# Patient Record
Sex: Female | Born: 1965 | Race: Black or African American | Hispanic: No | Marital: Single | Smoking: Never smoker
Health system: Southern US, Community
[De-identification: ages and names within clinical notes are randomized; demographics above are authoritative.]

## PROBLEM LIST (undated history)

## (undated) ENCOUNTER — Emergency Department (HOSPITAL_BASED_OUTPATIENT_CLINIC_OR_DEPARTMENT_OTHER): Admission: EM | Payer: Federal, State, Local not specified - PPO | Source: Home / Self Care

## (undated) DIAGNOSIS — G723 Periodic paralysis: Secondary | ICD-10-CM

## (undated) DIAGNOSIS — G4700 Insomnia, unspecified: Secondary | ICD-10-CM

## (undated) DIAGNOSIS — T2120XA Burn of second degree of trunk, unspecified site, initial encounter: Secondary | ICD-10-CM

## (undated) DIAGNOSIS — J309 Allergic rhinitis, unspecified: Secondary | ICD-10-CM

## (undated) DIAGNOSIS — I10 Essential (primary) hypertension: Secondary | ICD-10-CM

## (undated) DIAGNOSIS — D510 Vitamin B12 deficiency anemia due to intrinsic factor deficiency: Secondary | ICD-10-CM

## (undated) HISTORY — DX: Burn of second degree of trunk, unspecified site, initial encounter: T21.20XA

## (undated) HISTORY — PX: HEMORRHOID SURGERY: SHX153

## (undated) HISTORY — PX: TUBAL LIGATION: SHX77

## (undated) HISTORY — DX: Insomnia, unspecified: G47.00

## (undated) HISTORY — DX: Allergic rhinitis, unspecified: J30.9

## (undated) HISTORY — DX: Periodic paralysis: G72.3

## (undated) HISTORY — DX: Vitamin B12 deficiency anemia due to intrinsic factor deficiency: D51.0

## (undated) HISTORY — DX: Essential (primary) hypertension: I10

---

## 1999-06-03 ENCOUNTER — Emergency Department (HOSPITAL_COMMUNITY): Admission: EM | Admit: 1999-06-03 | Discharge: 1999-06-04 | Payer: Self-pay | Admitting: Emergency Medicine

## 2000-11-10 ENCOUNTER — Emergency Department (HOSPITAL_COMMUNITY): Admission: EM | Admit: 2000-11-10 | Discharge: 2000-11-10 | Payer: Self-pay | Admitting: Internal Medicine

## 2001-03-06 ENCOUNTER — Other Ambulatory Visit: Admission: RE | Admit: 2001-03-06 | Discharge: 2001-03-06 | Payer: Self-pay | Admitting: Obstetrics and Gynecology

## 2002-01-19 ENCOUNTER — Emergency Department (HOSPITAL_COMMUNITY): Admission: EM | Admit: 2002-01-19 | Discharge: 2002-01-19 | Payer: Self-pay | Admitting: Emergency Medicine

## 2002-05-17 ENCOUNTER — Other Ambulatory Visit: Admission: RE | Admit: 2002-05-17 | Discharge: 2002-05-17 | Payer: Self-pay | Admitting: Obstetrics and Gynecology

## 2003-05-07 ENCOUNTER — Inpatient Hospital Stay (HOSPITAL_COMMUNITY): Admission: EM | Admit: 2003-05-07 | Discharge: 2003-05-16 | Payer: Self-pay | Admitting: Emergency Medicine

## 2003-06-15 ENCOUNTER — Other Ambulatory Visit: Admission: RE | Admit: 2003-06-15 | Discharge: 2003-06-15 | Payer: Self-pay | Admitting: Obstetrics and Gynecology

## 2003-06-15 ENCOUNTER — Encounter: Admission: RE | Admit: 2003-06-15 | Discharge: 2003-09-13 | Payer: Self-pay | Admitting: General Surgery

## 2003-09-14 ENCOUNTER — Encounter: Admission: RE | Admit: 2003-09-14 | Discharge: 2003-11-23 | Payer: Self-pay | Admitting: General Surgery

## 2004-12-06 ENCOUNTER — Other Ambulatory Visit: Admission: RE | Admit: 2004-12-06 | Discharge: 2004-12-06 | Payer: Self-pay | Admitting: Obstetrics and Gynecology

## 2005-12-20 ENCOUNTER — Other Ambulatory Visit: Admission: RE | Admit: 2005-12-20 | Discharge: 2005-12-20 | Payer: Self-pay | Admitting: Obstetrics and Gynecology

## 2006-10-06 ENCOUNTER — Emergency Department (HOSPITAL_COMMUNITY): Admission: EM | Admit: 2006-10-06 | Discharge: 2006-10-06 | Payer: Self-pay | Admitting: Emergency Medicine

## 2006-11-27 ENCOUNTER — Ambulatory Visit: Payer: Self-pay | Admitting: Internal Medicine

## 2006-11-28 ENCOUNTER — Ambulatory Visit: Payer: Self-pay | Admitting: Internal Medicine

## 2006-11-28 LAB — CONVERTED CEMR LAB
ALT: 13 units/L (ref 0–40)
AST: 18 units/L (ref 0–37)
Albumin: 3.8 g/dL (ref 3.5–5.2)
Alkaline Phosphatase: 72 units/L (ref 39–117)
BUN: 7 mg/dL (ref 6–23)
Basophils Absolute: 0 10*3/uL (ref 0.0–0.1)
Basophils Relative: 0.6 % (ref 0.0–1.0)
CO2: 28 meq/L (ref 19–32)
Calcium: 9.3 mg/dL (ref 8.4–10.5)
Chloride: 107 meq/L (ref 96–112)
Cholesterol: 158 mg/dL (ref 0–200)
Creatinine, Ser: 0.7 mg/dL (ref 0.4–1.2)
Eosinophils Absolute: 0.1 10*3/uL (ref 0.0–0.6)
Eosinophils Relative: 1.5 % (ref 0.0–5.0)
GFR calc Af Amer: 118 mL/min
GFR calc non Af Amer: 98 mL/min
Glucose, Bld: 92 mg/dL (ref 70–99)
HCT: 37.8 % (ref 36.0–46.0)
HDL: 38.3 mg/dL — ABNORMAL LOW (ref >39.0)
Hemoglobin: 12.9 g/dL (ref 12.0–15.0)
LDL Cholesterol: 110 mg/dL — ABNORMAL HIGH (ref 0–99)
Lymphocytes Relative: 48.4 % — ABNORMAL HIGH (ref 12.0–46.0)
MCHC: 34 g/dL (ref 30.0–36.0)
MCV: 89.9 fL (ref 78.0–100.0)
Monocytes Absolute: 0.4 10*3/uL (ref 0.2–0.7)
Monocytes Relative: 8.4 % (ref 3.0–11.0)
Neutro Abs: 1.7 10*3/uL (ref 1.4–7.7)
Neutrophils Relative %: 41.1 % — ABNORMAL LOW (ref 43.0–77.0)
Platelets: 315 10*3/uL (ref 150–400)
Potassium: 3.8 meq/L (ref 3.5–5.1)
RBC: 4.21 M/uL (ref 3.87–5.11)
RDW: 12.5 % (ref 11.5–14.6)
Rheumatoid fact SerPl-aCnc: <20 intl units/mL — ABNORMAL LOW (ref 0.0–20.0)
Sed Rate: 18 mm/hr (ref 0–25)
Sodium: 140 meq/L (ref 135–145)
Total Bilirubin: 1 mg/dL (ref 0.3–1.2)
Total CHOL/HDL Ratio: 4.1
Total Protein: 7.2 g/dL (ref 6.0–8.3)
Triglycerides: 48 mg/dL (ref 0–149)
VLDL: 10 mg/dL (ref 0–40)
Vitamin B-12: 239 pg/mL (ref 211–911)
WBC: 4.2 10*3/uL — ABNORMAL LOW (ref 4.5–10.5)

## 2006-12-02 ENCOUNTER — Ambulatory Visit: Payer: Self-pay | Admitting: Internal Medicine

## 2007-10-02 ENCOUNTER — Emergency Department (HOSPITAL_COMMUNITY): Admission: EM | Admit: 2007-10-02 | Discharge: 2007-10-02 | Payer: Self-pay | Admitting: Emergency Medicine

## 2007-10-07 ENCOUNTER — Ambulatory Visit: Payer: Self-pay | Admitting: Internal Medicine

## 2007-10-07 DIAGNOSIS — T2120XA Burn of second degree of trunk, unspecified site, initial encounter: Secondary | ICD-10-CM | POA: Insufficient documentation

## 2007-10-09 ENCOUNTER — Telehealth (INDEPENDENT_AMBULATORY_CARE_PROVIDER_SITE_OTHER): Payer: Self-pay | Admitting: *Deleted

## 2007-11-04 ENCOUNTER — Emergency Department (HOSPITAL_COMMUNITY): Admission: EM | Admit: 2007-11-04 | Discharge: 2007-11-04 | Payer: Self-pay | Admitting: Emergency Medicine

## 2007-11-10 ENCOUNTER — Ambulatory Visit: Payer: Self-pay | Admitting: Internal Medicine

## 2007-11-10 LAB — CONVERTED CEMR LAB
BUN: 8 mg/dL (ref 6–23)
Basophils Absolute: 0 10*3/uL (ref 0.0–0.1)
Basophils Relative: 0.4 % (ref 0.0–1.0)
Bilirubin Urine: NEGATIVE
CO2: 29 meq/L (ref 19–32)
Calcium: 9.5 mg/dL (ref 8.4–10.5)
Chloride: 106 meq/L (ref 96–112)
Creatinine, Ser: 0.7 mg/dL (ref 0.4–1.2)
Crystals: NEGATIVE
Eosinophils Absolute: 0 10*3/uL (ref 0.0–0.7)
Eosinophils Relative: 0.2 % (ref 0.0–5.0)
GFR calc Af Amer: 117 mL/min
GFR calc non Af Amer: 97 mL/min
Glucose, Bld: 112 mg/dL — ABNORMAL HIGH (ref 70–99)
HCT: 41.5 % (ref 36.0–46.0)
Hemoglobin, Urine: NEGATIVE
Hemoglobin: 13.7 g/dL (ref 12.0–15.0)
Ketones, ur: NEGATIVE mg/dL
Leukocytes, UA: NEGATIVE
Lymphocytes Relative: 26.7 % (ref 12.0–46.0)
MCHC: 33 g/dL (ref 30.0–36.0)
MCV: 90.1 fL (ref 78.0–100.0)
Monocytes Absolute: 0.2 10*3/uL (ref 0.1–1.0)
Monocytes Relative: 4.3 % (ref 3.0–12.0)
Mucus, UA: NEGATIVE
Neutro Abs: 4 10*3/uL (ref 1.4–7.7)
Neutrophils Relative %: 68.4 % (ref 43.0–77.0)
Nitrite: NEGATIVE
Platelets: 279 10*3/uL (ref 150–400)
Potassium: 4.5 meq/L (ref 3.5–5.1)
RBC / HPF: NONE SEEN
RBC: 4.61 M/uL (ref 3.87–5.11)
RDW: 11.8 % (ref 11.5–14.6)
Sodium: 141 meq/L (ref 135–145)
Specific Gravity, Urine: >=1.03 (ref 1.000–1.03)
Urine Glucose: NEGATIVE mg/dL
Urobilinogen, UA: 0.2 (ref 0.0–1.0)
WBC: 5.7 10*3/uL (ref 4.5–10.5)
pH: 6 (ref 5.0–8.0)

## 2007-11-11 ENCOUNTER — Telehealth: Payer: Self-pay | Admitting: Internal Medicine

## 2008-06-16 ENCOUNTER — Ambulatory Visit: Payer: Self-pay | Admitting: Internal Medicine

## 2008-06-16 ENCOUNTER — Telehealth: Payer: Self-pay | Admitting: Family Medicine

## 2008-06-22 ENCOUNTER — Telehealth: Payer: Self-pay | Admitting: Internal Medicine

## 2009-02-17 ENCOUNTER — Telehealth: Payer: Self-pay | Admitting: Internal Medicine

## 2009-02-18 ENCOUNTER — Encounter: Payer: Self-pay | Admitting: Internal Medicine

## 2009-02-23 ENCOUNTER — Ambulatory Visit: Payer: Self-pay | Admitting: Cardiovascular Disease

## 2009-02-23 ENCOUNTER — Ambulatory Visit: Payer: Self-pay | Admitting: Internal Medicine

## 2009-02-23 DIAGNOSIS — I10 Essential (primary) hypertension: Secondary | ICD-10-CM | POA: Insufficient documentation

## 2009-02-23 LAB — CONVERTED CEMR LAB
Free T4: 0.7 ng/dL (ref 0.6–1.6)
TSH: 1.17 microintl units/mL (ref 0.35–5.50)

## 2009-02-24 ENCOUNTER — Telehealth: Payer: Self-pay | Admitting: Internal Medicine

## 2009-02-28 ENCOUNTER — Ambulatory Visit: Payer: Self-pay | Admitting: Internal Medicine

## 2009-03-03 ENCOUNTER — Telehealth: Payer: Self-pay | Admitting: Internal Medicine

## 2009-03-03 ENCOUNTER — Ambulatory Visit: Payer: Self-pay | Admitting: Internal Medicine

## 2009-03-08 ENCOUNTER — Ambulatory Visit: Payer: Self-pay | Admitting: Internal Medicine

## 2009-03-08 DIAGNOSIS — G4700 Insomnia, unspecified: Secondary | ICD-10-CM | POA: Insufficient documentation

## 2009-03-17 ENCOUNTER — Telehealth: Payer: Self-pay | Admitting: Internal Medicine

## 2009-07-21 ENCOUNTER — Ambulatory Visit: Payer: Self-pay | Admitting: Internal Medicine

## 2009-07-29 ENCOUNTER — Ambulatory Visit: Payer: Self-pay | Admitting: Family Medicine

## 2009-07-31 ENCOUNTER — Telehealth (INDEPENDENT_AMBULATORY_CARE_PROVIDER_SITE_OTHER): Payer: Self-pay | Admitting: *Deleted

## 2009-08-11 ENCOUNTER — Telehealth: Payer: Self-pay | Admitting: Internal Medicine

## 2009-11-14 ENCOUNTER — Ambulatory Visit: Payer: Self-pay | Admitting: Internal Medicine

## 2009-11-14 DIAGNOSIS — G723 Periodic paralysis: Secondary | ICD-10-CM | POA: Insufficient documentation

## 2009-11-14 DIAGNOSIS — J309 Allergic rhinitis, unspecified: Secondary | ICD-10-CM | POA: Insufficient documentation

## 2009-11-21 ENCOUNTER — Telehealth (INDEPENDENT_AMBULATORY_CARE_PROVIDER_SITE_OTHER): Payer: Self-pay | Admitting: *Deleted

## 2009-11-23 ENCOUNTER — Ambulatory Visit: Payer: Self-pay | Admitting: Internal Medicine

## 2009-11-28 ENCOUNTER — Ambulatory Visit: Payer: Self-pay | Admitting: Internal Medicine

## 2009-11-29 ENCOUNTER — Telehealth: Payer: Self-pay | Admitting: Internal Medicine

## 2009-12-14 ENCOUNTER — Telehealth: Payer: Self-pay | Admitting: Internal Medicine

## 2009-12-27 ENCOUNTER — Telehealth: Payer: Self-pay | Admitting: Internal Medicine

## 2010-01-01 ENCOUNTER — Encounter: Payer: Self-pay | Admitting: Internal Medicine

## 2010-02-01 ENCOUNTER — Telehealth: Payer: Self-pay | Admitting: Internal Medicine

## 2010-03-26 ENCOUNTER — Telehealth: Payer: Self-pay | Admitting: Internal Medicine

## 2010-05-13 ENCOUNTER — Emergency Department (HOSPITAL_COMMUNITY): Admission: EM | Admit: 2010-05-13 | Discharge: 2010-05-13 | Payer: Self-pay | Admitting: Emergency Medicine

## 2010-05-21 ENCOUNTER — Telehealth: Payer: Self-pay | Admitting: Internal Medicine

## 2010-05-25 ENCOUNTER — Telehealth: Payer: Self-pay | Admitting: Internal Medicine
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
## 2010-07-19 NOTE — Assessment & Plan Note (Signed)
Summary: per flag add on @4 :30 pt aware/elev bp/cd   Vital Signs:  Patient profile:   45 year old female Weight:      172 pounds BMI:     28.73 Temp:     99.0 degrees F oral Pulse rate:   71 / minute BP sitting:   138 / 92  (left arm) Cuff size:   regular  Vitals Entered By: Lamar Sprinkles, CMA (November 23, 2009 5:17 PM) CC: Elevated BP, h/a's   Primary Care Provider:  Norins  CC:  Elevated BP and h/a's.  History of Present Illness: Patient presents for elevated blood pressure despite 4 drug therapy. She does report that she has been taking pseudoephedrine daily since her last visit until told to stop yesterday. BP 138/02 off sudafed. She does have persistent right parietal HA. She denies any focal neurologic findings.   Current Medications (verified): 1)  Tribenzor 40-10-25 Mg Tabs (Olmesartan-Amlodipine-Hctz) .Marland Kitchen.. 1 By Mouth Once Daily 2)  Bystolic 10 Mg Tabs (Nebivolol Hcl) .Marland Kitchen.. 1 By Mouth Once Daily 3)  Temazepam 15 Mg Caps (Temazepam) .Marland Kitchen.. 1 By Mouth  At Bedtime 4)  Potassium Chloride Cr 8 Meq Cr-Tabs (Potassium Chloride) .Marland Kitchen.. 1 By Mouth Once Daily  Allergies (verified): 1)  ! Hydrocodone PMH-FH-SH reviewed-no changes except otherwise noted  Review of Systems       The patient complains of headaches.  The patient denies anorexia, fever, weight loss, hoarseness, chest pain, syncope, dyspnea on exertion, abdominal pain, severe indigestion/heartburn, muscle weakness, difficulty walking, depression, and enlarged lymph nodes.    Physical Exam  General:  WNWD AA female in no acute distress Eyes:  pupils equal, pupils round, corneas and lenses clear, and no injection.   Lungs:  normal respiratory effort and normal breath sounds.   Heart:  normal rate and regular rhythm.   Msk:  enalrgement of tendon - flexor pollicis longus right. Tender in the anatomic snuff box. Postive finkelstein test.  Neurologic:  alert & oriented X3, cranial nerves II-XII intact, and gait normal.     Skin:  turgor normal and color normal-deeply tanned.   Psych:  Oriented X3, memory intact for recent and remote, normally interactive, and good eye contact.     Impression & Recommendations:  Problem # 1:  HYPERTENSION, SEVERE (ICD-401.0) Elevations most likely due to decongestant. Also, headache most likely related to HTN  Plan - leave off sudafed           monitor BP - call in results  Her updated medication list for this problem includes:    Tribenzor 40-10-25 Mg Tabs (Olmesartan-amlodipine-hctz) .Marland Kitchen... 1 by mouth once daily    Bystolic 10 Mg Tabs (Nebivolol hcl) .Marland Kitchen... 1 by mouth once daily  Problem # 2:  DE QUERVAIN'S TENOSYNOVITIS (ICD-727.04) classic presentation - due to over use of right wrist. Declined cortisone injection.  Plan - reduce work of right wrist if possible          heat          NSAIDs  Complete Medication List: 1)  Tribenzor 40-10-25 Mg Tabs (Olmesartan-amlodipine-hctz) .Marland Kitchen.. 1 by mouth once daily 2)  Bystolic 10 Mg Tabs (Nebivolol hcl) .Marland Kitchen.. 1 by mouth once daily 3)  Temazepam 15 Mg Caps (Temazepam) .Marland Kitchen.. 1 by mouth  at bedtime 4)  Potassium Chloride Cr 8 Meq Cr-tabs (Potassium chloride) .Marland Kitchen.. 1 by mouth once daily

## 2010-07-19 NOTE — Progress Notes (Signed)
  Phone Note Outgoing Call   Reason for Call: Get patient information Summary of Call: please call patient to see if her tenosynovitis is better after injection.  Thanks Initial call taken by: Jacques Navy MD,  November 29, 2009 8:39 PM  Follow-up for Phone Call        left mess to call office back w/update Follow-up by: Lamar Sprinkles, CMA,  December 04, 2009 10:17 AM  Additional Follow-up for Phone Call Additional follow up Details #1::        called pt no answer    Additional Follow-up for Phone Call Additional follow up Details #2::    lmoam Follow-up by: Ami Bullins CMA,  December 07, 2009 1:56 PM  Additional Follow-up for Phone Call Additional follow up Details #3:: Details for Additional Follow-up Action Taken: letter sent to pt to call with an update. Additional Follow-up by: Ami Bullins CMA,  December 08, 2009 10:33 AM

## 2010-07-19 NOTE — Progress Notes (Signed)
Summary: CALL  Phone Note Call from Patient Call back at [phone]   Summary of Call: Patient is requesting a call back.  Initial call taken by: Lamar Sprinkles, CMA,  August 11, 2009 2:22 PM  Follow-up for Phone Call        Pt c/o sharp chest pain & pressure x 1 wk. Pain is only when she lays down and immediatly goes away when she sits up.  Follow-up by: Lamar Sprinkles, CMA,  August 11, 2009 5:21 PM  Additional Follow-up for Phone Call Additional follow up Details #1::        called - left a message - sounds like reflux. Recommend prilosec OTC, elevated head of bed. For persistent concerns call nurse line in am for appt in Sat clinic Additional Follow-up by: Jacques Navy MD,  August 11, 2009 5:53 PM
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
## 2010-07-19 NOTE — Assessment & Plan Note (Signed)
Summary: wrist injection/SD   Vital Signs:  Patient profile:   45 year old female Height:      65 inches Weight:      171 pounds BMI:     28.56 O2 Sat:      98 % on Room air Temp:     97.0 degrees F oral Pulse rate:   67 / minute BP sitting:   140 / 88  (left arm) Cuff size:   regular  Vitals Entered By: Bill Salinas CMA (November 28, 2009 4:41 PM)  O2 Flow:  Room air   Primary Care Provider:  Decarlos Empey   History of Present Illness: At last visit she was diagnosed with DeQuervain's tenosynovitis. She declined steroid injection in favor of heat and NSAID. She returns today for persistent pain and is requesting a cortisone injection.   Current Medications (verified): 1)  Tribenzor 40-10-25 Mg Tabs (Olmesartan-Amlodipine-Hctz) .Marland Kitchen.. 1 By Mouth Once Daily 2)  Bystolic 10 Mg Tabs (Nebivolol Hcl) .Marland Kitchen.. 1 By Mouth Once Daily 3)  Temazepam 15 Mg Caps (Temazepam) .Marland Kitchen.. 1 By Mouth  At Bedtime 4)  Potassium Chloride Cr 8 Meq Cr-Tabs (Potassium Chloride) .Marland Kitchen.. 1 By Mouth Once Daily  Allergies (verified): 1)  ! Hydrocodone PMH-FH-SH reviewed-no changes except otherwise noted  Review of Systems MS:  Complains of loss of strength; denies joint pain, joint redness, joint swelling, low back pain, mid back pain, muscle aches, and cramps.  Physical Exam  General:  Well-developed,well-nourished,in no acute distress; alert,appropriate and cooperative throughout examination Msk:  Postive Finklestein's test, tender at the right anatomic snuff box of the wrist. Bony protruberance just cephlad to the anatomic snuff box.    Impression & Recommendations:  Problem # 1:  DE QUERVAIN'S TENOSYNOVITIS (ICD-727.04)  tendon sheath steroid injection. Patient still had pain at the close of the visit. If the pain persists will refer to hand surgeon.  Orders: Injection, Tendon / Ligament (62952) Depo- Medrol 40mg  (J1030)  Complete Medication List: 1)  Tribenzor 40-10-25 Mg Tabs (Olmesartan-amlodipine-hctz)  .Marland Kitchen.. 1 by mouth once daily 2)  Bystolic 10 Mg Tabs (Nebivolol hcl) .Marland Kitchen.. 1 by mouth once daily 3)  Temazepam 15 Mg Caps (Temazepam) .Marland Kitchen.. 1 by mouth  at bedtime 4)  Potassium Chloride Cr 8 Meq Cr-tabs (Potassium chloride) .Marland Kitchen.. 1 by mouth once daily   Procedure Note  Injections: The patient complains of pain. Indication: acute pain  Procedure # 1: tendon sheath injection    Location: anatomic snuff box right wrist    Technique: #30 g 1 " needle    Medication: 40 mg depomedrol    Anesthesia: 2% xylocaine    Comment: verbal informed consent. after betadiene prep, topical skin freeze spray infiltrated tendon with 40mg  depo and .5cc 2% xylo. Pt claimed this hurt. She did have some decreased pain.   Cleaned and prepped with: betadine Wound dressing: bandaid Additional Instructions: routine injection precautions.

## 2010-07-19 NOTE — Progress Notes (Signed)
Summary: CALL A NURSE   Phone Note From Other Clinic   Summary of Call: Recieved fax from Call a nurse, pt c/o elevated bp and was advised to call office and scheduled apt. Will f/u w/ pt today for update.  Initial call taken by: Lamar Sprinkles, CMA,  December 27, 2009 12:28 PM  Follow-up for Phone Call        left mess to call office back................Marland KitchenLamar Sprinkles, CMA  December 27, 2009 2:36 PM   LMOVM for pt to call back.Alvy Beal Archie CMA  December 28, 2009 1:50 PM   left mess to call office back.............Marland KitchenLamar Sprinkles, CMA  December 29, 2009 6:03 PM   Additional Follow-up for Phone Call Additional follow up Details #1::        left mess to call office back. Multiple attempts, signing phone note. Will mail letter Additional Follow-up by: Lamar Sprinkles, CMA,  January 01, 2010 6:10 PM

## 2010-07-19 NOTE — Letter (Signed)
Summary: Out of Work  [facility]  [address] [place], [place] [address]   Phone: [phone]  Fax: [phone]    Nov 14, 2009   Employee:  Bently A Folk    To Whom It May Concern:   For Medical reasons, please excuse the above named employee from work for the following dates:  Start:   May 31 , 2011  End:   November 15, 2009  If you need additional information, please feel free to contact our office.         Sincerely,    Jacques Navy MD

## 2010-07-19 NOTE — Progress Notes (Signed)
Summary: CALL  Phone Note Call from Patient Call back at [phone]   Summary of Call: Patient is requesting a call back regarding an rx.  Initial call taken by: Lamar Sprinkles, CMA,  May 21, 2010 2:02 PM  Follow-up for Phone Call        pt states she was given RectaGel [facility] and her Murrell Converse has moved. She states she needs this gel ASAP [facility] to [facility] [address] please advise Follow-up by: Ami Bullins CMA,  May 21, 2010 2:28 PM  Additional Follow-up for Phone Call Additional follow up Details #1::        ok Additional Follow-up by: Etta Grandchild MD,  May 21, 2010 2:37 PM    Additional Follow-up for Phone Call Additional follow up Details #2::    medication called into [facility] [address] Follow-up by: Ami Bullins CMA,  May 21, 2010 3:12 PM  New/Updated Medications: * RECTAGEL HC 20G TUBE apply to affected area three times a day as needed
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
## 2010-07-19 NOTE — Assessment & Plan Note (Signed)
Summary: ELEV BP (168/90)AND SINUS INFECTION /NWS--rs'/cd   Vital Signs:  Patient profile:   45 year old female Height:      65 inches Weight:      173 pounds BMI:     28.89 O2 Sat:      98 % on Room air Temp:     98.9 degrees F oral Pulse rate:   62 / minute BP sitting:   144 / 98  (left arm) Cuff size:   regular  Vitals Entered By: Bill Salinas CMA (July 21, 2009 4:02 PM)  O2 Flow:  Room air CC: pt here for evaluation of elevated BP, she also complains of frequent headaches and noses bleeds x 2 weeks/ ab   Primary Care Provider:  Kinte Trim  CC:  pt here for evaluation of elevated BP and she also complains of frequent headaches and noses bleeds x 2 weeks/ ab.  History of Present Illness: Patient with persistently elevated blood pressure: readings at home with systolics 170's, diastoics 90-100. she has had headaches and nosebleeds. No focal neurolgic symptoms. Chart reviewed - she has had normal CT brain, normal CT angio to rule out RAS. She has been on bystolic 10mg  once daily and lisinopril/hct 20/25 once daily.  Current Medications (verified): 1)  Retin-A .... Every Other Day 2)  Lisinopril-Hydrochlorothiazide 20-25 Mg Tabs (Lisinopril-Hydrochlorothiazide) .Marland Kitchen.. 1 By Mouth Once Daily 3)  Bystolic 10 Mg Tabs (Nebivolol Hcl) .Marland Kitchen.. 1 By Mouth Once Daily 4)  Temazepam 15 Mg Caps (Temazepam) .Marland Kitchen.. 1 By Mouth  At Bedtime  Allergies (verified): 1)  ! Hydrocodone PMH-FH-SH reviewed-no changes except otherwise noted  Review of Systems       The patient complains of headaches.  The patient denies anorexia, fever, weight loss, weight gain, decreased hearing, hoarseness, chest pain, syncope, dyspnea on exertion, hemoptysis, abdominal pain, severe indigestion/heartburn, incontinence, muscle weakness, difficulty walking, depression, abnormal bleeding, and angioedema.    Physical Exam  General:  Well-developed,well-nourished,in no acute distress; alert,appropriate and cooperative  throughout examination Head:  normocephalic and atraumatic.   Eyes:  vision grossly intact, pupils equal, pupils round, corneas and lenses clear, and no injection.   Lungs:  normal respiratory effort.   Heart:  normal rate and regular rhythm.   Neurologic:  alert & oriented X3, cranial nerves II-XII intact, and gait normal.   Skin:  turgor normal and color normal.   Psych:  Oriented X3, normally interactive, and good eye contact.     Impression & Recommendations:  Problem # 1:  HYPERTENSION, SEVERE (ICD-401.0) Difficult to control BP.  Plan - life style improvement: provided information on disease state, diet and exercise from [facility] and UpToDate, as well as handouts.           continue byustolic 10mg  once daily           change to tribenzor (olmesartan 40mg , amlodipine 10, HCTZ 25) once daily            return in 10-14 days for follow -up   Her updated medication list for this problem includes:    Tribenzor 40-10-25 Mg Tabs (Olmesartan-amlodipine-hctz) .Marland Kitchen... 1 by mouth once daily    Bystolic 10 Mg Tabs (Nebivolol hcl) .Marland Kitchen... 1 by mouth once daily  Complete Medication List: 1)  Retin-a  .... Every other day 2)  Tribenzor 40-10-25 Mg Tabs (Olmesartan-amlodipine-hctz) .Marland Kitchen.. 1 by mouth once daily 3)  Bystolic 10 Mg Tabs (Nebivolol hcl) .Marland Kitchen.. 1 by mouth once daily 4)  Temazepam 15 Mg Caps (  Temazepam) .Marland Kitchen.. 1 by mouth  at bedtime

## 2010-07-19 NOTE — Progress Notes (Signed)
Summary: Call a nurse report x3  Phone Note Other Incoming   Caller: Call a nurse report Summary of Call: Documentation of fax rec'd:  07-29-2009 7:33:43 am---Operator:  Camilled DiMatteis. Patient called c/o HA, eyes hurting, and chest hurting as well. Patient is congested and cannot cough up phlem despite trying. Per patient symptoms started yesterday. Temp 101.9 on 2-11 and at time of call. Patient would like to be seen in office and was instructed to call back when the office opened. Initial call taken by: Lucious Groves,  July 31, 2009 11:35 AM  Follow-up for Phone Call        Patient called back to nurse--Operator:  Geanie Berlin    07-29-2009 9:12:26 am--- C/O dry cough with chest and rib pain, HA, Right ear and body aches. Temp 101.9 by mouth 7:30 am, BP  117/70 7:30 am. Operator advised patient be seen in office within 4 hours. Follow-up by: Lucious Groves,  July 31, 2009 11:56 AM  Additional Follow-up for Phone Call Additional follow up Details #1::        Patient called back to nurse--Operator:  Migdalia Dk   07-30-2009 5:17:17 am--- Patient called stating that she was seen yesterday dx with flu and placed on Tamiflu. Patient wanted to know how it worked. Patient c/o freesing and fever continues. Operator advised patient how Tamiflu works, to take Tylenol or Ibuprofen for fever, and increase fluids and rest. Patient expressed understanding per operator. Additional Follow-up by: Lucious Groves,  July 31, 2009 12:03 PM    Additional Follow-up for Phone Call Additional follow up Details #2::    would not have her come to the office if already started on tamiflu for influenza type illness. She should come to the office if she has respiratory distress or refractory N/V Follow-up by: Jacques Navy MD,  July 31, 2009 1:03 PM  Additional Follow-up for Phone Call Additional follow up Details #3:: Details for Additional Follow-up Action Taken: left message on voicemail to call back  to office.Lucious Groves,  August 01, 2009 3:22 PM  Left message on voicemail to call back to office. Lucious Groves  August 02, 2009 11:14 AM    informed pt of advisement, she states she is feeling better Additional Follow-up by: Ami Bullins CMA,  August 03, 2009 1:29 PM

## 2010-07-19 NOTE — Progress Notes (Signed)
Summary: H/a & elevated BP   Phone Note Other Incoming   Caller: Pt [phone] Summary of Call: pt states she is still having nauseated headaches and nothing OTC is helping. What do you advise? Initial call taken by: Ami Bullins CMA,  November 21, 2009 4:42 PM  Follow-up for Phone Call        she needs to be seen Follow-up by: Etta Grandchild MD,  November 22, 2009 7:52 AM  Additional Follow-up for Phone Call Additional follow up Details #1::        Patient is requesting work in apt with Dr Debby Bud tomorrow. She has been taking sudafed daily, not the limited 2 days as she was told. She c/o elevated bp, 160/101 today. Advised pt to stop sudafed and come in for eval. Pt declined office visit wants wk in w/norins.  Additional Follow-up by: Lamar Sprinkles, CMA,  November 22, 2009 2:57 PM    Additional Follow-up for Phone Call Additional follow up Details #2::    ok for work in tomorrow Follow-up by: Jacques Navy MD,  November 22, 2009 5:37 PM  Additional Follow-up for Phone Call Additional follow up Details #3:: Details for Additional Follow-up Action Taken: Please add to IDX in 4:30 apt tomorrow. I already spoke w/patient. Lynford Humphrey!!!!................Marland KitchenLamar Sprinkles, CMA  November 22, 2009 6:55 PM     Added to sched today @4 :30pm Additional Follow-up by: Verdell Face,  November 23, 2009 8:14 AM

## 2010-07-19 NOTE — Progress Notes (Signed)
Summary: Call Report  Phone Note Other Incoming   Caller: [facility]A-[facility]urse Summary of Call: [facility] Triage Call Report Triage Record Num: [id number] Operator: Geanie Berlin Patient Name: Hannah Conley Call Date & Time: 03/24/2010 8:50:52AM Patient Phone: [phone] PCP: Illene Regulus Patient Gender: Female PCP Fax : [phone] Patient DOB: December 05, 1965 Practice Name: [facility] Reason for Call: 45 yo LMP: Depoporvera. CAlling re HTN; with > BP since 03/18/10. On Lisinopril & Bystolic. Reports daily headaches. BP 160/96 L arm 0730. Replrts blurred vision L eye. Advised to see ED now fornew onset of blurred vision per HTN, Diagnosed or Suspected Guideline. Protocol(s) Used: Hypertension, Diagnosed or Suspected Recommended Outcome per Protocol: See ED Immediately Reason for Outcome: New onset of visual disturbances such as double or blurred vision, spots before eyes or flashing lights Care Advice:  ~ Protect the patient from falling or other harm.  ~ Another adult should drive.  ~ Do not give the patient anything to eat or drink.  ~ IMMEDIATE ACTION Write down provider's name. List or place the following in a bag for transport with the patient: current prescription and/or nonprescription medications; alternative treatments, therapies and medications; and street drugs.  ~ 10/ Initial call taken by: Margaret Pyle, CMA,  March 26, 2010 8:22 AM

## 2010-07-19 NOTE — Progress Notes (Signed)
Summary: UPDATE  Phone Note Call from Patient Call back at [phone]   Summary of Call: Pt returned call regarding update of her wrist. She is doing much better. Still has concerns about elevated bp at times. She will monitor BP and bring in record of readings. She is taking meds as directed. Pt will contact office next week for nurse visit bp check if she has time off work or if BP remains elevated.  Initial call taken by: Lamar Sprinkles, CMA,  December 14, 2009 10:41 AM

## 2010-07-19 NOTE — Progress Notes (Signed)
Summary: REQ ALT MED  Phone Note Call from Patient Call back at [phone]   Summary of Call: Patient is requesting a call back.  Initial call taken by: Lamar Sprinkles, CMA,  May 25, 2010 11:22 AM  Follow-up for Phone Call        Rectagel is not avail, pharm suggests anamantle as alternative. Please advise.   Pt req a call back when complete.  Follow-up by: Lamar Sprinkles, CMA,  May 25, 2010 11:42 AM  Additional Follow-up for Phone Call Additional follow up Details #1::        called pt. eScribed anamantle HC    New/Updated Medications: LIDOCAINE-HYDROCORTISONE ACE 3-0.5 % CREA (LIDOCAINE-HYDROCORTISONE ACE) applya two times a day as needed for hemorrhoids Prescriptions: LIDOCAINE-HYDROCORTISONE ACE 3-0.5 % CREA (LIDOCAINE-HYDROCORTISONE ACE) applya two times a day as needed for hemorrhoids  #60g x 1   Entered and Authorized by:   Jacques Navy MD   Signed by:   Jacques Navy MD on 05/25/2010   Method used:   Electronically to        [facility] (retail)       [address]       [place], [place]  [address]       Ph: [phone] or [phone]       Fax: [phone]   RxID:   [id number]

## 2010-07-19 NOTE — Letter (Signed)
Summary: Generic Letter  [facility]  [address] [place], [place] [address]   Phone: [phone]  Fax: [phone]    01/01/2010  Hannah Conley [address], [address] [place], [place]  [address]  Dear Ms. Lick,   We were recently informed of a call to our after hours service with concerns of elevated blood pressure. Please contact the office for follow up with Dr Debby Bud for any continued concerns.      Sincerely,   Lamar Sprinkles, CMA (AAMA) for Dr Judie Petit. Norins

## 2010-08-29 LAB — GC/CHLAMYDIA PROBE AMP, GENITAL
Chlamydia, DNA Probe: NEGATIVE
GC Probe Amp, Genital: NEGATIVE

## 2010-08-29 LAB — URINALYSIS, ROUTINE W REFLEX MICROSCOPIC
Bilirubin Urine: NEGATIVE
Glucose, UA: NEGATIVE mg/dL
Hgb urine dipstick: NEGATIVE
Ketones, ur: NEGATIVE mg/dL
Nitrite: NEGATIVE
Protein, ur: NEGATIVE mg/dL
Specific Gravity, Urine: 1.021 (ref 1.005–1.030)
Urobilinogen, UA: 0.2 mg/dL (ref 0.0–1.0)
pH: 8.5 — ABNORMAL HIGH (ref 5.0–8.0)

## 2010-08-29 LAB — DIFFERENTIAL
Basophils Absolute: 0 10*3/uL (ref 0.0–0.1)
Basophils Relative: 1 % (ref 0–1)
Eosinophils Absolute: 0.1 10*3/uL (ref 0.0–0.7)
Eosinophils Relative: 1 % (ref 0–5)
Lymphocytes Relative: 57 % — ABNORMAL HIGH (ref 12–46)
Lymphs Abs: 2.7 10*3/uL (ref 0.7–4.0)
Monocytes Absolute: 0.4 10*3/uL (ref 0.1–1.0)
Monocytes Relative: 9 % (ref 3–12)
Neutro Abs: 1.5 10*3/uL — ABNORMAL LOW (ref 1.7–7.7)
Neutrophils Relative %: 32 % — ABNORMAL LOW (ref 43–77)

## 2010-08-29 LAB — CBC
HCT: 40.3 % (ref 36.0–46.0)
Hemoglobin: 13.8 g/dL (ref 12.0–15.0)
MCH: 31 pg (ref 26.0–34.0)
MCHC: 34.1 g/dL (ref 30.0–36.0)
MCV: 90.8 fL (ref 78.0–100.0)
Platelets: 314 10*3/uL (ref 150–400)
RBC: 4.44 MIL/uL (ref 3.87–5.11)
RDW: 12.9 % (ref 11.5–15.5)
WBC: 4.7 10*3/uL (ref 4.0–10.5)

## 2010-08-29 LAB — BASIC METABOLIC PANEL
BUN: 9 mg/dL (ref 6–23)
CO2: 26 mEq/L (ref 19–32)
Calcium: 9.2 mg/dL (ref 8.4–10.5)
Chloride: 109 mEq/L (ref 96–112)
Creatinine, Ser: 0.87 mg/dL (ref 0.4–1.2)
GFR calc Af Amer: >60 mL/min (ref >60)
GFR calc non Af Amer: >60 mL/min (ref >60)
Glucose, Bld: 102 mg/dL — ABNORMAL HIGH (ref 70–99)
Potassium: 4.3 mEq/L (ref 3.5–5.1)
Sodium: 140 mEq/L (ref 135–145)

## 2010-08-29 LAB — URINE CULTURE
Colony Count: NO GROWTH
Culture  Setup Time: 201111271724
Culture: NO GROWTH

## 2010-08-29 LAB — WET PREP, GENITAL
Clue Cells Wet Prep HPF POC: NONE SEEN
Trich, Wet Prep: NONE SEEN
Yeast Wet Prep HPF POC: NONE SEEN

## 2010-08-29 LAB — POCT PREGNANCY, URINE: Preg Test, Ur: NEGATIVE

## 2010-10-30 NOTE — Assessment & Plan Note (Signed)
[facility]                                 ON-CALL NOTE   NAME:Hannah Conley, Hannah Conley                    MRN:          [id number]  DATE:03/02/2009                            DOB:          [birth year]    PHONE NUMBER:  [phone].   PRIMARY CARE PHYSICIAN:  Rosalyn Gess. Norins, MD   DATE OF SERVICE:  On March 02, 2009, at approximately 9:30 at night.   SUBJECTIVE:  Hannah Conley has been having elevated blood pressures for  several days, but tonight it has reached a peak of 191/149.  She denies  any symptoms such as chest pain, shortness of breath.  No headache.  She  does have some heaviness behind her eyes.  She denies weakness,  numbness, slurred speech, vomiting, nausea.  We reviewed her medications  and it appears she has some room to increase her lisinopril HCTZ 10/12.5  mg.  I will have her take an additional dose this evening and she has an  appointment in the morning with her primary care doctor.  She was  instructed on symptoms that should have her go to the emergency room.  In addition, if her blood pressure remains this elevated, despite  additional medication, I recommend she go in to be seen this evening.     Kerby Nora, MD  Electronically Signed    AB/MedQ  DD: 03/02/2009  DT: 03/03/2009  Job #: [id number]
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
## 2010-11-02 NOTE — H&P (Signed)
Hannah Conley, KLINKE             ACCOUNT NO.:  [id number]   MEDICAL RECORD NO.:  [id number]           PATIENT TYPE:   LOCATION:                                 FACILITY:   PHYSICIAN:  Guy Sandifer. Henderson Cloud, M.D. DATE OF BIRTH:  07-Aug-1965   DATE OF ADMISSION:  DATE OF DISCHARGE:                              HISTORY & PHYSICAL   CHIEF COMPLAINT:  Irregular menses.   HISTORY OF PRESENT ILLNESS:  This patient is a 45 year old divorced  black female, G0, P0 with irregular menses.  Some flows are becoming  quite heavy, changing a tampon every 1-2 hours.  Ultrasound in my office  on January 06, 2008, revealed uterus measuring 7.2 x 3.2 x 4.2 cm.  Ovaries  appeared normal.  On sonohysterogram, there is a 5-mm polypoid-type mass  in the endometrial cavity.  After discussion of the options, the patient  is being admitted for hysteroscopy with resectoscope, dilatation and  curettage.  Potential risks and complications have been discussed  preoperatively.   PAST MEDICAL HISTORY:  1. Sarcoidosis.  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  3. History of depression.  4. Vulvar herpes.   PAST SURGICAL HISTORY:  1. Bunion surgery in 1992 and 1993.  2. Uterine myomectomy, resection of left utero-ovarian leiomyoma, and      ablation of endometriosis.   MEDICATIONS:  Singulair, Valtrex, amlodipine, gluten, bilberry, a.c.  carbamide, alendronate, Alphagan, antacid, and Effexor XR.   ALLERGIES:  OMNICEPT, SOME ANTIBIOTICS, and SOME PENICILLIN.   FAMILY HISTORY:  Heart disease, pulmonary disease, chronic hypertension,  and cancer.   SOCIAL HISTORY:  She denies tobacco, alcohol, or drug abuse.   REVIEW OF SYSTEMS:  NEUROLOGIC:  She denies headache.  CARDIAC:  She  denies chest pain.  PULMONARY:  Sarcoidosis as above.   PHYSICAL EXAMINATION:  VITAL SIGNS:  Height 5 feet 0 inches, weight 155  pounds, and blood pressure 118/86.  HEENT:  Without thyromegaly.  LUNGS:  Clear to auscultation.  HEART:   Regular rate and rhythm.  ABDOMEN:  Soft and nontender without masses.  PELVIC:  Vulva, vagina, and cervix without lesion.  Uterus is  retroverted, normal in size, and mobile.  Adnexa nontender without  masses.  EXTREMITIES:  Grossly within normal limits.  NEUROLOGIC:  Grossly within normal limits.   ASSESSMENT:  Irregular menses.   PLAN:  Hysteroscopy, resectoscope, dilatation and curettage.      Guy Sandifer Henderson Cloud, M.D.  Electronically Signed     JET/MEDQ  D:  03/16/2008  T:  03/17/2008  Job:  [id number]
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
## 2010-12-10 ENCOUNTER — Telehealth: Payer: Self-pay

## 2010-12-10 DIAGNOSIS — N390 Urinary tract infection, site not specified: Secondary | ICD-10-CM

## 2010-12-10 NOTE — Telephone Encounter (Signed)
Patient called c/o  urinary pain/frequancy, and low back pain. Patient thinks that she may have an UTI. I spoke with MD who advised ok for pt to come in to lab for U/A. Patient informed and will come by in the am 12/11/10.

## 2010-12-11 ENCOUNTER — Encounter: Payer: Self-pay | Admitting: Internal Medicine

## 2010-12-11 ENCOUNTER — Ambulatory Visit (INDEPENDENT_AMBULATORY_CARE_PROVIDER_SITE_OTHER): Payer: Federal, State, Local not specified - PPO | Admitting: Internal Medicine

## 2010-12-11 ENCOUNTER — Other Ambulatory Visit (INDEPENDENT_AMBULATORY_CARE_PROVIDER_SITE_OTHER): Payer: Federal, State, Local not specified - PPO

## 2010-12-11 VITALS — BP 138/92 | HR 75 | Temp 97.7°F | Wt 177.0 lb

## 2010-12-11 DIAGNOSIS — N390 Urinary tract infection, site not specified: Secondary | ICD-10-CM

## 2010-12-11 LAB — URINALYSIS, ROUTINE W REFLEX MICROSCOPIC
Bilirubin Urine: NEGATIVE
Ketones, ur: NEGATIVE
Nitrite: NEGATIVE
Specific Gravity, Urine: >=1.03 (ref 1.000–1.030)
Total Protein, Urine: 100
Urine Glucose: NEGATIVE
Urobilinogen, UA: 0.2 (ref 0.0–1.0)
pH: 6 (ref 5.0–8.0)

## 2010-12-11 MED ORDER — PHENAZOPYRIDINE HCL 200 MG PO TABS: 200.0000 mg | ORAL_TABLET | Freq: Three times a day (TID) | ORAL | Status: DC | PRN

## 2010-12-11 MED ORDER — CIPROFLOXACIN HCL 250 MG PO TABS: 250.0000 mg | ORAL_TABLET | Freq: Two times a day (BID) | ORAL | Status: DC

## 2010-12-11 NOTE — Progress Notes (Signed)
  Subjective:    Patient ID: Hannah Conley, female    DOB: [birth year], 45 y.o.   MRN: [id number]  HPI Hannah Conley presents for a 5 day history of dysuria, suprapubic pain and flank pain. She has not documented in fever but she has been tired, low on energy, sweaty. U/A is positive   PMH, FamHx and SocHx reviewed for any changes and relevance.    Review of Systems Review of Systems  Constitutional:  Negative for fever, chills, activity change and unexpected weight change.  HEENT:  Negative for hearing loss, ear pain, congestion, neck stiffness and postnasal drip. Negative for sore throat or swallowing problems. Negative for dental complaints.   Eyes: Negative for vision loss or change in visual acuity.  Respiratory: Negative for chest tightness and wheezing.   Cardiovascular: Negative for chest pain and palpitation. No decreased exercise tolerance Gastrointestinal: No change in bowel habit. No bloating or gas. No reflux or indigestion Genitourinary: Positive for urgency, frequency, flank pain and difficulty urinating.  Musculoskeletal: Negative for myalgias, back pain, arthralgias and gait problem.  Neurological: Negative for dizziness, tremors, weakness and headaches.  Hematological: Negative for adenopathy.  Psychiatric/Behavioral: Negative for behavioral problems and dysphoric mood.        Objective:   Physical Exam Vitals noted - no fever Gen'l - WNWD AA woman Abd - tender suprapubic area, tender to percussion over the flanks.   Urine dipstick shows positive for WBC's and positive for leukocytes.  Micro exam: >50 WBC's per HPF and + bacteria.        Assessment & Plan:  UTI - patient with UTI and possible pyelo.  Plan - hold keflex           cipro 250 mg bid x 10 days.           Pyridium 200mg  every 8 hrs.

## 2010-12-11 NOTE — Patient Instructions (Signed)
Urinary tract infection. Plan - stop the cephalexin for now. Start cipro twice a day for 10 days. Take pyridium 200mg   Every 8 hours for discomfort - this will turn the urine orange. Drink a lot of fluids. Take tylenol for fever or aches. Return to work on Thursday.    Urinary Tract Infection (UTI) Infections of the urinary tract can start in several places. A bladder infection (cystitis), a kidney infection (pyelonephritis), and a prostate infection (prostatitis) are different types of urinary tract infections. They usually get better if treated with medicines (antibiotics) that kill germs. Take all the medicine until it is gone. You or your child may feel better in a few days, but TAKE ALL MEDICINE or the infection may not respond and may become more difficult to treat. HOME CARE INSTRUCTIONS  Drink enough water and fluids to keep the urine clear or pale yellow. Cranberry juice is especially recommended, in addition to large amounts of water.   Avoid caffeine, tea, and carbonated beverages. They tend to irritate the bladder.   Alcohol may irritate the prostate.   Only take over-the-counter or prescription medicines for pain, discomfort, or fever as directed by your caregiver.  FINDING OUT THE RESULTS OF YOUR TEST Not all test results are available during your visit. If your or your child's test results are not back during the visit, make an appointment with your caregiver to find out the results. Do not assume everything is normal if you have not heard from your caregiver or the medical facility. It is important for you to follow up on all test results. TO PREVENT FURTHER INFECTIONS:  Empty the bladder often. Avoid holding urine for long periods of time.   After a bowel movement, women should cleanse from front to back. Use each tissue only once.   Empty the bladder before and after sexual intercourse.  SEEK MEDICAL CARE IF:  There is back pain.   You or your child has an oral  temperature above 100.   Your baby is older than 3 months with a rectal temperature of 100.5 F (38.1 C) or higher for more than 1 day.   Your or your child's problems (symptoms) are no better in 3 days. Return sooner if you or your child is getting worse.  SEEK IMMEDIATE MEDICAL CARE IF:  There is severe back pain or lower abdominal pain.   You or your child develops chills.   You or your child has an oral temperature above 100, not controlled by medicine.   Your baby is older than 3 months with a rectal temperature of 102 F (38.9 C) or higher.   Your baby is 64 months old or younger with a rectal temperature of 100.4 F (38 C) or higher.   There is nausea or vomiting.   There is continued burning or discomfort with urination.  MAKE SURE YOU:  Understand these instructions.   Will watch this condition.   Will get help right away if you or your child is not doing well or gets worse.  Document Released: 03/13/2005 Document Re-Released: 08/28/2009 [facility] Patient Information 2011 [place], [place].

## 2011-01-25 ENCOUNTER — Other Ambulatory Visit (INDEPENDENT_AMBULATORY_CARE_PROVIDER_SITE_OTHER): Payer: Federal, State, Local not specified - PPO

## 2011-01-25 ENCOUNTER — Other Ambulatory Visit: Payer: Self-pay | Admitting: Internal Medicine

## 2011-01-25 ENCOUNTER — Encounter: Payer: Self-pay | Admitting: Internal Medicine

## 2011-01-25 ENCOUNTER — Telehealth: Payer: Self-pay | Admitting: *Deleted

## 2011-01-25 ENCOUNTER — Other Ambulatory Visit (INDEPENDENT_AMBULATORY_CARE_PROVIDER_SITE_OTHER): Payer: Federal, State, Local not specified - PPO | Admitting: Internal Medicine

## 2011-01-25 ENCOUNTER — Ambulatory Visit (INDEPENDENT_AMBULATORY_CARE_PROVIDER_SITE_OTHER): Payer: Federal, State, Local not specified - PPO | Admitting: Internal Medicine

## 2011-01-25 ENCOUNTER — Encounter: Payer: Self-pay | Admitting: *Deleted

## 2011-01-25 DIAGNOSIS — Z0000 Encounter for general adult medical examination without abnormal findings: Secondary | ICD-10-CM

## 2011-01-25 DIAGNOSIS — R5383 Other fatigue: Secondary | ICD-10-CM

## 2011-01-25 DIAGNOSIS — M549 Dorsalgia, unspecified: Secondary | ICD-10-CM

## 2011-01-25 DIAGNOSIS — I10 Essential (primary) hypertension: Secondary | ICD-10-CM

## 2011-01-25 DIAGNOSIS — R300 Dysuria: Secondary | ICD-10-CM

## 2011-01-25 DIAGNOSIS — N390 Urinary tract infection, site not specified: Secondary | ICD-10-CM

## 2011-01-25 DIAGNOSIS — R5381 Other malaise: Secondary | ICD-10-CM

## 2011-01-25 LAB — HEPATIC FUNCTION PANEL
ALT: 13 U/L (ref 0–35)
Albumin: 4.3 g/dL (ref 3.5–5.2)
Alkaline Phosphatase: 80 U/L (ref 39–117)
Bilirubin, Direct: 0.1 mg/dL (ref 0.0–0.3)
Total Protein: 7.6 g/dL (ref 6.0–8.3)

## 2011-01-25 LAB — URINALYSIS, ROUTINE W REFLEX MICROSCOPIC
Nitrite: NEGATIVE
Specific Gravity, Urine: 1.015 (ref 1.000–1.030)
Total Protein, Urine: NEGATIVE
pH: 6 (ref 5.0–8.0)

## 2011-01-25 LAB — BASIC METABOLIC PANEL
BUN: 12 mg/dL (ref 6–23)
Calcium: 9.4 mg/dL (ref 8.4–10.5)
Creatinine, Ser: 0.9 mg/dL (ref 0.4–1.2)
GFR: 92.25 mL/min (ref >60.00)

## 2011-01-25 LAB — CBC WITH DIFFERENTIAL/PLATELET
Eosinophils Relative: 1.2 % (ref 0.0–5.0)
Lymphocytes Relative: 35.4 % (ref 12.0–46.0)
Monocytes Absolute: 0.4 10*3/uL (ref 0.1–1.0)
Monocytes Relative: 7.8 % (ref 3.0–12.0)
Neutrophils Relative %: 55.3 % (ref 43.0–77.0)
Platelets: 330 10*3/uL (ref 150.0–400.0)
WBC: 5.2 10*3/uL (ref 4.5–10.5)

## 2011-01-25 MED ORDER — PHENAZOPYRIDINE HCL 200 MG PO TABS: 200.0000 mg | ORAL_TABLET | Freq: Three times a day (TID) | ORAL | Status: AC | PRN

## 2011-01-25 MED ORDER — CIPROFLOXACIN HCL 500 MG PO TABS: 500.0000 mg | ORAL_TABLET | Freq: Two times a day (BID) | ORAL | Status: AC

## 2011-01-25 NOTE — Telephone Encounter (Signed)
MD called pt concerning her lab results. Pt is wanting to extend her work note. Per md ok to generate a new work note to be out tomorrow also. Put up front for her to pick-up.Marland KitchenMarland Kitchen8/10/12@2 :14pm/LMB

## 2011-01-25 NOTE — Patient Instructions (Signed)
It was good to see you today. Test(s) ordered today. Your results will be called to you after review. If any changes need to be made, you will be notified at that time. Work excuse note given today Medications reviewed, no changes at this time.
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
## 2011-03-13 LAB — RAPID STREP SCREEN (MED CTR MEBANE ONLY): Streptococcus, Group A Screen (Direct): NEGATIVE

## 2011-06-20 ENCOUNTER — Ambulatory Visit (INDEPENDENT_AMBULATORY_CARE_PROVIDER_SITE_OTHER): Payer: Federal, State, Local not specified - PPO | Admitting: Internal Medicine

## 2011-06-20 ENCOUNTER — Encounter: Payer: Self-pay | Admitting: Internal Medicine

## 2011-06-20 DIAGNOSIS — M545 Low back pain, unspecified: Secondary | ICD-10-CM

## 2011-06-20 DIAGNOSIS — R233 Spontaneous ecchymoses: Secondary | ICD-10-CM

## 2011-06-20 DIAGNOSIS — T148XXA Other injury of unspecified body region, initial encounter: Secondary | ICD-10-CM

## 2011-06-20 DIAGNOSIS — R5381 Other malaise: Secondary | ICD-10-CM

## 2011-06-20 DIAGNOSIS — I10 Essential (primary) hypertension: Secondary | ICD-10-CM

## 2011-06-20 DIAGNOSIS — R238 Other skin changes: Secondary | ICD-10-CM

## 2011-06-20 MED ORDER — CYCLOBENZAPRINE HCL 10 MG PO TABS: 10.0000 mg | ORAL_TABLET | Freq: Three times a day (TID) | ORAL | Status: AC | PRN
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
## 2011-06-21 ENCOUNTER — Ambulatory Visit (INDEPENDENT_AMBULATORY_CARE_PROVIDER_SITE_OTHER)
Admission: RE | Admit: 2011-06-21 | Discharge: 2011-06-21 | Disposition: A | Discharge status: Home / Self Care | Payer: Federal, State, Local not specified - PPO | Source: Ambulatory Visit | Attending: Internal Medicine | Admitting: Internal Medicine

## 2011-06-21 ENCOUNTER — Other Ambulatory Visit (INDEPENDENT_AMBULATORY_CARE_PROVIDER_SITE_OTHER): Payer: Federal, State, Local not specified - PPO

## 2011-06-21 DIAGNOSIS — R5381 Other malaise: Secondary | ICD-10-CM

## 2011-06-21 DIAGNOSIS — M545 Low back pain, unspecified: Secondary | ICD-10-CM

## 2011-06-21 DIAGNOSIS — R5383 Other fatigue: Secondary | ICD-10-CM

## 2011-06-21 DIAGNOSIS — R238 Other skin changes: Secondary | ICD-10-CM

## 2011-06-21 DIAGNOSIS — R233 Spontaneous ecchymoses: Secondary | ICD-10-CM

## 2011-06-21 LAB — CBC WITH DIFFERENTIAL/PLATELET
Basophils Absolute: 0 10*3/uL (ref 0.0–0.1)
HCT: 39 % (ref 36.0–46.0)
Hemoglobin: 13.3 g/dL (ref 12.0–15.0)
Lymphs Abs: 1.8 10*3/uL (ref 0.7–4.0)
MCHC: 34.1 g/dL (ref 30.0–36.0)
MCV: 90.2 fl (ref 78.0–100.0)
Monocytes Absolute: 0.3 10*3/uL (ref 0.1–1.0)
Monocytes Relative: 7 % (ref 3.0–12.0)
Neutro Abs: 2.2 10*3/uL (ref 1.4–7.7)
Platelets: 329 10*3/uL (ref 150.0–400.0)
RDW: 13.5 % (ref 11.5–14.6)

## 2011-06-21 LAB — PROTIME-INR: Prothrombin Time: 11.1 s (ref 10.2–12.4)

## 2011-06-22 LAB — RETICULOCYTES: Retic Ct Pct: 1.2 % (ref 0.4–2.3)

## 2011-06-23 DIAGNOSIS — M545 Low back pain, unspecified: Secondary | ICD-10-CM | POA: Insufficient documentation

## 2011-06-23 DIAGNOSIS — T148XXA Other injury of unspecified body region, initial encounter: Secondary | ICD-10-CM | POA: Insufficient documentation

## 2011-06-23 NOTE — Assessment & Plan Note (Signed)
She reports increased bruising, prolonged bleeding with cuts,etc with a history of metromenorrhagia. She also c/o increased weakness, lack of stamina. PE unrevealing.  Plan- CBCD, retic count, CRP, ANA  Addendum - labs are normal range with ANA pending.

## 2011-06-23 NOTE — Assessment & Plan Note (Signed)
BP Readings from Last 3 Encounters:  06/20/11 122/88  01/25/11 122/80  12/11/10 138/92   Maintaining good control.  Plan- continue present meds

## 2011-06-23 NOTE — Assessment & Plan Note (Signed)
Patient presents with acute low back pain of a couple of weeks duration. By description she is having muscle spasm/cramping. Exam non-focal without evidence of radiculopathy.  Plan- L-S spine films          Rx- muscle relaxant          NSAIDs  Addendum- normal L-S spine films

## 2011-06-24 ENCOUNTER — Encounter: Payer: Self-pay | Admitting: Internal Medicine

## 2011-07-16 ENCOUNTER — Ambulatory Visit (INDEPENDENT_AMBULATORY_CARE_PROVIDER_SITE_OTHER): Payer: Federal, State, Local not specified - PPO | Admitting: Internal Medicine

## 2011-07-16 ENCOUNTER — Encounter: Payer: Self-pay | Admitting: Internal Medicine

## 2011-07-16 DIAGNOSIS — I10 Essential (primary) hypertension: Secondary | ICD-10-CM

## 2011-07-16 DIAGNOSIS — T148XXA Other injury of unspecified body region, initial encounter: Secondary | ICD-10-CM

## 2011-07-16 DIAGNOSIS — J069 Acute upper respiratory infection, unspecified: Secondary | ICD-10-CM

## 2011-07-16 DIAGNOSIS — M797 Fibromyalgia: Secondary | ICD-10-CM

## 2011-07-16 DIAGNOSIS — IMO0001 Reserved for inherently not codable concepts without codable children: Secondary | ICD-10-CM

## 2011-07-16 MED ORDER — AZITHROMYCIN 250 MG PO TABS: ORAL_TABLET | ORAL | Status: AC

## 2011-07-16 MED ORDER — MILNACIPRAN HCL 50 MG PO TABS: 50.0000 mg | ORAL_TABLET | Freq: Two times a day (BID) | ORAL | Status: DC

## 2011-07-16 NOTE — Patient Instructions (Addendum)
Upper respiratory infection with tender sinus cavities, low grade fever, lots of nasal drainage that is bitter and tastes bad. Plan - z-pak as directed ( an antibiotic you take for 5 days that gives 10 days of antibiotic coverage. Take sudafed 30 mg twice a day for sinus congestion. Take tylenol 500 mg one or two three times a day. Hydrate. Take vitamin C 1500 mg daily.  Fibromyalgia - diffuse muscle pain and fatigue. All the lab work we did looking for a cause of your pain was negative. See the handout. We will start Savella using a starter pack and a prescription for full strength savella with a discount card. Rx for 50 mg twice a day.   Upper Respiratory Infection, Adult An upper respiratory infection (URI) is also sometimes known as the common cold. The upper respiratory tract includes the nose, sinuses, throat, trachea, and bronchi. Bronchi are the airways leading to the lungs. Most people improve within 1 week, but symptoms can last up to 2 weeks. A residual cough may last even longer.   CAUSES Many different viruses can infect the tissues lining the upper respiratory tract. The tissues become irritated and inflamed and often become very moist. Mucus production is also common. A cold is contagious. You can easily spread the virus to others by oral contact. This includes kissing, sharing a glass, coughing, or sneezing. Touching your mouth or nose and then touching a surface, which is then touched by another person, can also spread the virus. SYMPTOMS   Symptoms typically develop 1 to 3 days after you come in contact with a cold virus. Symptoms vary from person to person. They may include:  Runny nose.     Sneezing.    Nasal congestion.     Sinus irritation.     Sore throat.     Loss of voice (laryngitis).     Cough.    Fatigue.    Muscle aches.     Loss of appetite.     Headache.    Low-grade fever.  DIAGNOSIS   You might diagnose your own cold based on familiar symptoms,  since most people get a cold 2 to 3 times a year. Your caregiver can confirm this based on your exam. Most importantly, your caregiver can check that your symptoms are not due to another disease such as strep throat, sinusitis, pneumonia, asthma, or epiglottitis. Blood tests, throat tests, and X-rays are not necessary to diagnose a common cold, but they may sometimes be helpful in excluding other more serious diseases. Your caregiver will decide if any further tests are required. RISKS AND COMPLICATIONS   You may be at risk for a more severe case of the common cold if you smoke cigarettes, have chronic heart disease (such as heart failure) or lung disease (such as asthma), or if you have a weakened immune system. The very young and very old are also at risk for more serious infections. Bacterial sinusitis, middle ear infections, and bacterial pneumonia can complicate the common cold. The common cold can worsen asthma and chronic obstructive pulmonary disease (COPD). Sometimes, these complications can require emergency medical care and may be life-threatening. PREVENTION   The best way to protect against getting a cold is to practice good hygiene. Avoid oral or hand contact with people with cold symptoms. Wash your hands often if contact occurs. There is no clear evidence that vitamin C, vitamin E, echinacea, or exercise reduces the chance of developing a cold. However, it is always recommended  to get plenty of rest and practice good nutrition. TREATMENT   Treatment is directed at relieving symptoms. There is no cure. Antibiotics are not effective, because the infection is caused by a virus, not by bacteria. Treatment may include:  Increased fluid intake. Sports drinks offer valuable electrolytes, sugars, and fluids.     Breathing heated mist or steam (vaporizer or shower).     Eating chicken soup or other clear broths, and maintaining good nutrition.     Getting plenty of rest.     Using gargles or  lozenges for comfort.     Controlling fevers with ibuprofen or acetaminophen as directed by your caregiver.     Increasing usage of your inhaler if you have asthma.  Zinc gel and zinc lozenges, taken in the first 24 hours of the common cold, can shorten the duration and lessen the severity of symptoms. Pain medicines may help with fever, muscle aches, and throat pain. A variety of non-prescription medicines are available to treat congestion and runny nose. Your caregiver can make recommendations and may suggest nasal or lung inhalers for other symptoms.   HOME CARE INSTRUCTIONS    Only take over-the-counter or prescription medicines for pain, discomfort, or fever as directed by your caregiver.     Use a warm mist humidifier or inhale steam from a shower to increase air moisture. This may keep secretions moist and make it easier to breathe.     Drink enough water and fluids to keep your urine clear or pale yellow.     Rest as needed.     Return to work when your temperature has returned to normal or as your caregiver advises. You may need to stay home longer to avoid infecting others. You can also use a face mask and careful hand washing to prevent spread of the virus.  SEEK MEDICAL CARE IF:    After the first few days, you feel you are getting worse rather than better.     You need your caregiver's advice about medicines to control symptoms.     You develop chills, worsening shortness of breath, or brown or red sputum. These may be signs of pneumonia.     You develop yellow or brown nasal discharge or pain in the face, especially when you bend forward. These may be signs of sinusitis.     You develop a fever, swollen neck glands, pain with swallowing, or white areas in the back of your throat. These may be signs of strep throat.  SEEK IMMEDIATE MEDICAL CARE IF:    You have a fever.     You develop severe or persistent headache, ear pain, sinus pain, or chest pain.     You develop  wheezing, a prolonged cough, cough up blood, or have a change in your usual mucus (if you have chronic lung disease).     You develop sore muscles or a stiff neck.  Document Released: 11/27/2000 Document Revised: 02/13/2011 Document Reviewed: 10/05/2010 [facility] Patient Information 2012 [place], [place].

## 2011-07-19 ENCOUNTER — Telehealth: Payer: Self-pay | Admitting: *Deleted

## 2011-07-19 NOTE — Telephone Encounter (Signed)
Pt was seen in the office on 1/29 for URI sxs-pt states that she is not feeling any better. She still c/o cough, sore throat and HA. Pt is requesting an rx for cough and sore throat. Please advise in MEN's absence-thanks.

## 2011-07-21 DIAGNOSIS — M797 Fibromyalgia: Secondary | ICD-10-CM | POA: Insufficient documentation

## 2011-07-21 MED ORDER — BENZONATATE 100 MG PO CAPS: 100.0000 mg | ORAL_CAPSULE | Freq: Two times a day (BID) | ORAL | Status: AC | PRN

## 2011-07-21 NOTE — Assessment & Plan Note (Signed)
Normal hematologic labs with no etiology for bruising determined. The extent of bruising is mild. No further work- up indicated at this time.

## 2011-07-21 NOTE — Progress Notes (Signed)
  Subjective:    Patient ID: Hannah Conley, female    DOB: 07/26/1965, 46 y.o.   MRN: [id number]  HPI Hannah Conley presents for evaluation of sinus pressure and discomfort, sore throat and feeling bad. She has recently had a major evaluation for generalized weakness and muscle pain that was unrevealing. She has not had high fevers, cough or purulent mucus.  She continues to have diffuse myalgias that are limiting her activities due to a shortage of energy.  I have reviewed the patient's medical history in detail and updated the computerized patient record.    Review of Systems System review is negative for any constitutional, cardiac, pulmonary, GI or neuro symptoms or complaints other than as described in the HPI.     Objective:   Physical Exam  Pulmonary/Chest:      Musculoskeletal:       Arms:  Filed Vitals:   07/16/11 1557  BP: 128/82  Pulse: 93  Temp: 98 F (36.7 C)  Resp: 16   Gen'l- WNWD AA woman in no acute distress HEENT - tender to percussion over the frontal and maxillary sinuses. Posterior pharynx w/o erythema or exudate. EACs/TMs normal Neck- supple Nodes - negative in the cervical or supraclavicular region Pulm - normal respirations and normal breath sounds MSK - tender to palpation at the cardinal locations back and anterior chest       Assessment & Plan:  URI - with symptoms of pressure, pain, drainage. Paln - Z-pak           Supportive care

## 2011-07-21 NOTE — Assessment & Plan Note (Signed)
Continued muscle pain and fatigue. Discussed diagnosis of fibromyalgia. Patient information from UpToDate provided.  Plan - trial ov Savella - starter pak provided.

## 2011-07-21 NOTE — Assessment & Plan Note (Signed)
BP Readings from Last 3 Encounters:  07/16/11 128/82  06/20/11 122/88  01/25/11 122/80   Great control. Plan -continue present medications

## 2011-07-21 NOTE — Telephone Encounter (Signed)
Tessalon prn Thx

## 2011-07-22 NOTE — Telephone Encounter (Signed)
Pt informed

## 2011-10-21 ENCOUNTER — Ambulatory Visit (INDEPENDENT_AMBULATORY_CARE_PROVIDER_SITE_OTHER): Payer: Federal, State, Local not specified - PPO | Admitting: Internal Medicine

## 2011-10-21 ENCOUNTER — Encounter: Payer: Self-pay | Admitting: Internal Medicine

## 2011-10-21 VITALS — BP 132/80 | HR 66 | Temp 98.6°F | Resp 16 | Wt 186.0 lb

## 2011-10-21 DIAGNOSIS — M7020 Olecranon bursitis, unspecified elbow: Secondary | ICD-10-CM

## 2011-10-21 DIAGNOSIS — M7551 Bursitis of right shoulder: Secondary | ICD-10-CM

## 2011-10-21 DIAGNOSIS — G44219 Episodic tension-type headache, not intractable: Secondary | ICD-10-CM

## 2011-10-21 MED ORDER — IBUPROFEN 800 MG PO TABS: 800.0000 mg | ORAL_TABLET | Freq: Three times a day (TID) | ORAL | Status: DC | PRN
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
## 2011-10-21 NOTE — Patient Instructions (Addendum)
Headache - normal neurologic exam. Suspect that this is a muscle tension type headache. Plan - ibuprofen 800 mg 3 times a day. Watch for stomach irritation.  Shoulder pain - no grinding or clicking. Symptoms are consistent with bursitis. Plan - ibuprofen 800 mg 3 times a day. If the pain in the shoulder does not respond can consider a steroid injection.  Loose stools - not medically speaking diarrhea. Plan - take Fibercon or BeneFiber twice a day to add bulk to the stool.   You are not falling apart. If the headache does not respond to treatment or becomes more frequent I will refer you to Dr. Amelia Jo, a headache specialist.    Tension Headache (Muscle Contraction Headache) Tension headache is one of the most common causes of head pain. These headaches are usually felt as a pain over the top of your head and back of your neck. Stress, anxiety, and depression are common triggers for these headaches. Tension headaches are not life-threatening and will not lead to other types of headaches. Tension headaches can often be diagnosed by taking a history from the patient and a physical exam. Sometimes, further lab and x-ray studies are used to confirm the diagnosis. Your caregiver can advise you on how to get help solving problems that cause anxiety or stress. Antidepressants can be prescribed if depression is a problem. HOME CARE INSTRUCTIONS    If testing was done, call for your results. Remember, it is your responsibility to get the results of all testing. Do not assume everything is fine because you do not hear from your caregiver.   Only take over-the-counter or prescription medicines for pain, discomfort, or fever as directed by your caregiver.   Biofeedback, massage, or other relaxation techniques may be helpful.   Ice packs or heat to the head and neck can be used. Use these three to four times per day or as needed.   Physical therapy may be a useful addition to treatment.   If headaches  continue, even with therapy, you may need to think about lifestyle changes.   Avoid excessive use of pain killers, as rebound headaches can occur.  SEEK MEDICAL CARE IF:    You develop problems with medications prescribed.   You do not respond or get no relief from medications.   You have a change from the usual headache.   You develop nausea (feeling sick to your stomach) or vomiting.  SEEK IMMEDIATE MEDICAL CARE IF:    Your headache becomes severe.   You have an unexplained oral temperature above 102 F (38.9 C).   You develop a stiff neck.   You have loss of vision.   You have muscular weakness.   You have loss of muscular control.   You develop severe symptoms different from your first symptoms.   You start losing your balance or have trouble walking.   You feel faint or pass out.  MAKE SURE YOU:    Understand these instructions.   Will watch your condition.   Will get help right away if you are not doing well or get worse.  Document Released: 06/03/2005 Document Revised: 05/23/2011 Document Reviewed: 01/21/2008 [facility] Patient Information 2012 [place], [place].  Bursitis Bursitis is a swelling and soreness (inflammation) of a fluid-filled sac (bursa) that overlies and protects a joint. It can be caused by injury, overuse of the joint, arthritis or infection. The joints most likely to be affected are the elbows, shoulders, hips and knees. HOME CARE INSTRUCTIONS  Apply ice to the affected area for 15 to 20 minutes each hour while awake for 2 days. Put the ice in a plastic bag and place a towel between the bag of ice and your skin.   Rest the injured joint as much as possible, but continue to put the joint through a full range of motion, 4 times per day. (The shoulder joint especially becomes rapidly "frozen" if not used.) When the pain lessens, begin normal slow movements and usual activities.   Only take over-the-counter or prescription medicines for pain,  discomfort or fever as directed by your caregiver.   Your caregiver may recommend draining the bursa and injecting medicine into the bursa. This may help the healing process.   Follow all instructions for follow-up with your caregiver. This includes any orthopedic referrals, physical therapy and rehabilitation. Any delay in obtaining necessary care could result in a delay or failure of the bursitis to heal and chronic pain.  SEEK IMMEDIATE MEDICAL CARE IF:    Your pain increases even during treatment.   You develop an oral temperature above 102 F (38.9 C) and have heat and inflammation over the involved bursa.  MAKE SURE YOU:    Understand these instructions.   Will watch your condition.   Will get help right away if you are not doing well or get worse.  Document Released: 05/31/2000 Document Revised: 05/23/2011 Document Reviewed: 05/05/2009 [facility] Patient Information 2012 [place], [place].

## 2011-10-25 ENCOUNTER — Other Ambulatory Visit: Payer: Self-pay | Admitting: Internal Medicine

## 2011-10-29 ENCOUNTER — Other Ambulatory Visit: Payer: Self-pay | Admitting: Internal Medicine

## 2011-11-06 ENCOUNTER — Encounter (HOSPITAL_COMMUNITY): Payer: Self-pay | Admitting: *Deleted

## 2011-11-06 ENCOUNTER — Emergency Department (HOSPITAL_COMMUNITY)
Admission: EM | Admit: 2011-11-06 | Discharge: 2011-11-06 | Disposition: A | Discharge status: Home / Self Care | Payer: Federal, State, Local not specified - PPO | Attending: Emergency Medicine | Admitting: Emergency Medicine

## 2011-11-06 DIAGNOSIS — I10 Essential (primary) hypertension: Secondary | ICD-10-CM | POA: Insufficient documentation

## 2011-11-06 DIAGNOSIS — N12 Tubulo-interstitial nephritis, not specified as acute or chronic: Secondary | ICD-10-CM | POA: Insufficient documentation

## 2011-11-06 DIAGNOSIS — R300 Dysuria: Secondary | ICD-10-CM | POA: Insufficient documentation

## 2011-11-06 DIAGNOSIS — R1110 Vomiting, unspecified: Secondary | ICD-10-CM | POA: Insufficient documentation

## 2011-11-06 DIAGNOSIS — R109 Unspecified abdominal pain: Secondary | ICD-10-CM | POA: Insufficient documentation

## 2011-11-06 DIAGNOSIS — K921 Melena: Secondary | ICD-10-CM | POA: Insufficient documentation

## 2011-11-06 LAB — URINE MICROSCOPIC-ADD ON

## 2011-11-06 LAB — COMPREHENSIVE METABOLIC PANEL
BUN: 6 mg/dL (ref 6–23)
CO2: 25 mEq/L (ref 19–32)
Calcium: 9.8 mg/dL (ref 8.4–10.5)
Chloride: 104 mEq/L (ref 96–112)
Creatinine, Ser: 0.78 mg/dL (ref 0.50–1.10)
GFR calc Af Amer: >90 mL/min (ref >90)
GFR calc non Af Amer: >90 mL/min (ref >90)
Glucose, Bld: 96 mg/dL (ref 70–99)
Total Bilirubin: 0.4 mg/dL (ref 0.3–1.2)

## 2011-11-06 LAB — CBC
HCT: 40.2 % (ref 36.0–46.0)
Hemoglobin: 13.7 g/dL (ref 12.0–15.0)
MCV: 88 fL (ref 78.0–100.0)
RBC: 4.57 MIL/uL (ref 3.87–5.11)
RDW: 12.4 % (ref 11.5–15.5)
WBC: 6.2 10*3/uL (ref 4.0–10.5)

## 2011-11-06 LAB — URINALYSIS, ROUTINE W REFLEX MICROSCOPIC
Glucose, UA: NEGATIVE mg/dL
Nitrite: POSITIVE — AB
Protein, ur: NEGATIVE mg/dL

## 2011-11-06 LAB — DIFFERENTIAL
Eosinophils Relative: 1 % (ref 0–5)
Lymphocytes Relative: 41 % (ref 12–46)
Lymphs Abs: 2.6 10*3/uL (ref 0.7–4.0)
Monocytes Absolute: 0.3 10*3/uL (ref 0.1–1.0)
Monocytes Relative: 5 % (ref 3–12)

## 2011-11-06 MED ORDER — OXYCODONE-ACETAMINOPHEN 5-325 MG PO TABS: 1.0000 | ORAL_TABLET | Freq: Four times a day (QID) | ORAL | Status: AC | PRN

## 2011-11-06 MED ORDER — ONDANSETRON 4 MG PO TBDP: 4.0000 mg | ORAL_TABLET | Freq: Three times a day (TID) | ORAL | Status: DC | PRN

## 2011-11-06 MED ORDER — SODIUM CHLORIDE 0.9 % IV BOLUS (SEPSIS): 1000.0000 mL | Freq: Once | INTRAVENOUS | Status: DC

## 2011-11-06 MED ORDER — OXYCODONE-ACETAMINOPHEN 5-325 MG PO TABS
2.0000 | ORAL_TABLET | Freq: Once | ORAL | Status: AC
  Administered 2011-11-06: 2 via ORAL
  Filled 2011-11-06: qty 2

## 2011-11-06 MED ORDER — ONDANSETRON 4 MG PO TBDP
4.0000 mg | ORAL_TABLET | Freq: Once | ORAL | Status: AC
  Administered 2011-11-06: 4 mg via ORAL
  Filled 2011-11-06: qty 1

## 2011-11-06 MED ORDER — CIPROFLOXACIN HCL 500 MG PO TABS: 500.0000 mg | ORAL_TABLET | Freq: Two times a day (BID) | ORAL | Status: AC

## 2011-11-06 MED ORDER — CIPROFLOXACIN IN D5W 400 MG/200ML IV SOLN
400.0000 mg | Freq: Once | INTRAVENOUS | Status: AC
  Administered 2011-11-06: 400 mg via INTRAVENOUS
  Filled 2011-11-06: qty 200

## 2011-11-06 MED ORDER — ONDANSETRON 4 MG PO TBDP: 4.0000 mg | ORAL_TABLET | Freq: Three times a day (TID) | ORAL | Status: AC | PRN

## 2011-11-06 NOTE — Discharge Instructions (Signed)
Pyelonephritis, Adult Pyelonephritis is a kidney infection. In general, there are 2 main types of pyelonephritis:  Infections that come on quickly without any warning (acute pyelonephritis).   Infections that persist for a long period of time (chronic pyelonephritis).  CAUSES  Two main causes of pyelonephritis are:  Bacteria traveling from the bladder to the kidney. This is a problem especially in pregnant women. The urine in the bladder can become filled with bacteria from multiple causes, including:   Inflammation of the prostate gland (prostatitis).   Sexual intercourse in females.   Bladder infection (cystitis).   Bacteria traveling from the bloodstream to the tissue part of the kidney.  Problems that may increase your risk of getting a kidney infection include:  Diabetes.   Kidney stones or bladder stones.   Cancer.   Catheters placed in the bladder.   Other abnormalities of the kidney or ureter.  SYMPTOMS   Abdominal pain.   Pain in the side or flank area.   Fever.   Chills.   Upset stomach.   Blood in the urine (dark urine).   Frequent urination.   Strong or persistent urge to urinate.   Burning or stinging when urinating.  DIAGNOSIS  Your caregiver may diagnose your kidney infection based on your symptoms. A urine sample may also be taken. TREATMENT  In general, treatment depends on how severe the infection is.   If the infection is mild and caught early, your caregiver may treat you with oral antibiotics and send you home.   If the infection is more severe, the bacteria may have gotten into the bloodstream. This will require intravenous (IV) antibiotics and a hospital stay. Symptoms may include:   High fever.   Severe flank pain.   Shaking chills.   Even after a hospital stay, your caregiver may require you to be on oral antibiotics for a period of time.   Other treatments may be required depending upon the cause of the infection.  HOME CARE  INSTRUCTIONS   Take your antibiotics as directed. Finish them even if you start to feel better.   Make an appointment to have your urine checked to make sure the infection is gone.   Drink enough fluids to keep your urine clear or pale yellow.   Take medicines for the bladder if you have urgency and frequency of urination as directed by your caregiver.  SEEK IMMEDIATE MEDICAL CARE IF:   You have a fever.   You are unable to take your antibiotics or fluids.   You develop shaking chills.   You experience extreme weakness or fainting.   There is no improvement after 2 days of treatment.  MAKE SURE YOU:  Understand these instructions.   Will watch your condition.   Will get help right away if you are not doing well or get worse.  Document Released: 06/03/2005 Document Revised: 05/23/2011 Document Reviewed: 11/07/2010 [employer] Patient Information 2012 [employer]. 

## 2011-11-06 NOTE — ED Notes (Signed)
Pt reports "cloudy" colored urine and low back pain since Friday.  Pt also reports n/v and h/a and "black" colored stool.
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
## 2011-11-08 LAB — URINE CULTURE: Colony Count: >=100000

## 2011-11-09 NOTE — ED Provider Notes (Signed)
History/physical exam/procedure(s) were performed by non-physician practitioner and as supervising physician I was immediately available for consultation/collaboration. I have reviewed all notes and am in agreement with care and plan.   Hilario Quarry, MD 11/09/11 [phone]

## 2011-11-09 NOTE — ED Notes (Signed)
+  Urine. Patient treated with Cipro. Sensitive to same. Per protocol MD. °

## 2012-02-24 ENCOUNTER — Other Ambulatory Visit: Payer: Self-pay | Admitting: *Deleted

## 2012-02-24 MED ORDER — NEBIVOLOL HCL 10 MG PO TABS: 10.0000 mg | ORAL_TABLET | Freq: Every day | ORAL | Status: DC

## 2012-03-07 ENCOUNTER — Emergency Department (HOSPITAL_COMMUNITY): Payer: Federal, State, Local not specified - PPO

## 2012-03-07 ENCOUNTER — Encounter (HOSPITAL_COMMUNITY): Payer: Self-pay | Admitting: *Deleted

## 2012-03-07 ENCOUNTER — Emergency Department (HOSPITAL_COMMUNITY)
Admission: EM | Admit: 2012-03-07 | Discharge: 2012-03-07 | Disposition: A | Discharge status: Home / Self Care | Payer: Federal, State, Local not specified - PPO | Attending: Emergency Medicine | Admitting: Emergency Medicine

## 2012-03-07 DIAGNOSIS — K529 Noninfective gastroenteritis and colitis, unspecified: Secondary | ICD-10-CM

## 2012-03-07 DIAGNOSIS — R10817 Generalized abdominal tenderness: Secondary | ICD-10-CM | POA: Insufficient documentation

## 2012-03-07 DIAGNOSIS — K5289 Other specified noninfective gastroenteritis and colitis: Secondary | ICD-10-CM | POA: Insufficient documentation

## 2012-03-07 DIAGNOSIS — R109 Unspecified abdominal pain: Secondary | ICD-10-CM | POA: Insufficient documentation

## 2012-03-07 LAB — URINALYSIS, ROUTINE W REFLEX MICROSCOPIC
Bilirubin Urine: NEGATIVE
Glucose, UA: NEGATIVE mg/dL
Hgb urine dipstick: NEGATIVE
Ketones, ur: NEGATIVE mg/dL
Leukocytes, UA: NEGATIVE
Nitrite: NEGATIVE
Protein, ur: NEGATIVE mg/dL
Specific Gravity, Urine: 1.003 — ABNORMAL LOW (ref 1.005–1.030)
Urobilinogen, UA: 0.2 mg/dL (ref 0.0–1.0)
pH: 7 (ref 5.0–8.0)

## 2012-03-07 LAB — COMPREHENSIVE METABOLIC PANEL
ALT: 8 U/L (ref 0–35)
AST: 14 U/L (ref 0–37)
Albumin: 4 g/dL (ref 3.5–5.2)
Alkaline Phosphatase: 80 U/L (ref 39–117)
BUN: 5 mg/dL — ABNORMAL LOW (ref 6–23)
CO2: 24 mEq/L (ref 19–32)
Calcium: 9.4 mg/dL (ref 8.4–10.5)
Chloride: 105 mEq/L (ref 96–112)
Creatinine, Ser: 0.82 mg/dL (ref 0.50–1.10)
GFR calc Af Amer: >90 mL/min (ref >90)
GFR calc non Af Amer: 84 mL/min — ABNORMAL LOW (ref >90)
Glucose, Bld: 85 mg/dL (ref 70–99)
Potassium: 3.5 mEq/L (ref 3.5–5.1)
Sodium: 139 mEq/L (ref 135–145)
Total Bilirubin: 0.5 mg/dL (ref 0.3–1.2)
Total Protein: 7.3 g/dL (ref 6.0–8.3)

## 2012-03-07 LAB — CBC WITH DIFFERENTIAL/PLATELET
Basophils Absolute: 0 10*3/uL (ref 0.0–0.1)
Basophils Relative: 0 % (ref 0–1)
Eosinophils Absolute: 0.1 10*3/uL (ref 0.0–0.7)
Eosinophils Relative: 1 % (ref 0–5)
HCT: 39.6 % (ref 36.0–46.0)
Hemoglobin: 13.5 g/dL (ref 12.0–15.0)
Lymphocytes Relative: 46 % (ref 12–46)
Lymphs Abs: 2.7 10*3/uL (ref 0.7–4.0)
MCH: 29.9 pg (ref 26.0–34.0)
MCHC: 34.1 g/dL (ref 30.0–36.0)
MCV: 87.8 fL (ref 78.0–100.0)
Monocytes Absolute: 0.5 10*3/uL (ref 0.1–1.0)
Monocytes Relative: 8 % (ref 3–12)
Neutro Abs: 2.6 10*3/uL (ref 1.7–7.7)
Neutrophils Relative %: 45 % (ref 43–77)
Platelets: 332 10*3/uL (ref 150–400)
RBC: 4.51 MIL/uL (ref 3.87–5.11)
RDW: 12.5 % (ref 11.5–15.5)
WBC: 5.9 10*3/uL (ref 4.0–10.5)

## 2012-03-07 LAB — LIPASE, BLOOD: Lipase: 32 U/L (ref 11–59)

## 2012-03-07 LAB — PREGNANCY, URINE: Preg Test, Ur: NEGATIVE

## 2012-03-07 MED ORDER — CIPROFLOXACIN HCL 500 MG PO TABS: 500.0000 mg | ORAL_TABLET | Freq: Two times a day (BID) | ORAL | Status: DC

## 2012-03-07 MED ORDER — ONDANSETRON HCL 4 MG/2ML IJ SOLN
4.0000 mg | Freq: Once | INTRAMUSCULAR | Status: AC
  Administered 2012-03-07: 4 mg via INTRAVENOUS
  Filled 2012-03-07: qty 2

## 2012-03-07 MED ORDER — IOHEXOL 300 MG/ML  SOLN
20.0000 mL | INTRAMUSCULAR | Status: AC
  Administered 2012-03-07: 20 mL via ORAL

## 2012-03-07 MED ORDER — OXYCODONE-ACETAMINOPHEN 5-325 MG PO TABS: 1.0000 | ORAL_TABLET | Freq: Four times a day (QID) | ORAL | Status: DC | PRN

## 2012-03-07 MED ORDER — IOHEXOL 300 MG/ML  SOLN
100.0000 mL | Freq: Once | INTRAMUSCULAR | Status: AC | PRN
  Administered 2012-03-07: 100 mL via INTRAVENOUS

## 2012-03-07 MED ORDER — MORPHINE SULFATE 4 MG/ML IJ SOLN
6.0000 mg | Freq: Once | INTRAMUSCULAR | Status: AC
  Administered 2012-03-07: 4 mg via INTRAVENOUS
  Filled 2012-03-07: qty 2

## 2012-03-07 MED ORDER — HYDROMORPHONE HCL PF 1 MG/ML IJ SOLN
1.0000 mg | Freq: Once | INTRAMUSCULAR | Status: AC
  Administered 2012-03-07: 1 mg via INTRAVENOUS
  Filled 2012-03-07: qty 1

## 2012-03-07 MED ORDER — METRONIDAZOLE 500 MG PO TABS: 500.0000 mg | ORAL_TABLET | Freq: Two times a day (BID) | ORAL | Status: DC

## 2012-03-07 MED ORDER — PROMETHAZINE HCL 25 MG PO TABS: 25.0000 mg | ORAL_TABLET | Freq: Four times a day (QID) | ORAL | Status: DC | PRN

## 2012-03-07 MED ORDER — SODIUM CHLORIDE 0.9 % IV BOLUS (SEPSIS)
1000.0000 mL | Freq: Once | INTRAVENOUS | Status: AC
  Administered 2012-03-07: 1000 mL via INTRAVENOUS

## 2012-03-07 NOTE — ED Notes (Signed)
Pt back from CT

## 2012-03-07 NOTE — ED Notes (Signed)
Return from CT

## 2012-03-07 NOTE — ED Notes (Signed)
To ED for eval of diarrhea for past week. OTC meds not working.

## 2012-03-07 NOTE — ED Notes (Signed)
Patient transported to CT 

## 2012-03-07 NOTE — ED Notes (Signed)
Patient felt a lot better after laying down and getting some food.  Patient thinks it was the medication that made her feel sick.  Patient said they should have let her sit after the medication to see if there would be a reaction.  Now, patient is not hurting in her stomach.  Patient has Hannah Conley, a friend who is here to take her home.  Patient has no questions about her discharge instructions or her treatment.  Dr. Adriana Simas talked to the patient since she had already been discharge and advised her to go home and take it easy today.
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
## 2012-03-07 NOTE — ED Notes (Addendum)
Patient was up for discharge, became dizzy and diaphoretic upon time for discharge.  Patient asked her name and she agreed to incorrect name.  erpa aware at this time   She returned to room at 1455

## 2012-03-07 NOTE — ED Notes (Signed)
Pt ambulated to restroom. 
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
## 2012-03-07 NOTE — ED Notes (Signed)
Pt in CT.

## 2012-03-07 NOTE — ED Notes (Signed)
Pt. Reports diarrhea starting Sunday, states "I have to use the restroom every 80min-1hour". Also c/o low back pain, abdominal pain, and HA. Pt. States "I have been trying to drink fluids but every time I drink fluid it goes right through me." Pt. Has N/V x 2 days.

## 2012-03-07 NOTE — ED Notes (Signed)
Pt. Stated, I'm having a terrible headache and my stomach is hurting also.  Pt. Rates her pain an 8

## 2012-03-08 NOTE — ED Provider Notes (Signed)
Medical screening examination/treatment/procedure(s) were performed by non-physician practitioner and as supervising physician I was immediately available for consultation/collaboration.  Toy Baker, MD 03/08/12 [phone]

## 2012-03-09 ENCOUNTER — Ambulatory Visit (INDEPENDENT_AMBULATORY_CARE_PROVIDER_SITE_OTHER): Payer: Federal, State, Local not specified - PPO | Admitting: Internal Medicine

## 2012-03-09 ENCOUNTER — Encounter: Payer: Self-pay | Admitting: Internal Medicine

## 2012-03-09 VITALS — BP 122/90 | HR 86 | Temp 99.0°F | Resp 16 | Wt 180.0 lb

## 2012-03-09 DIAGNOSIS — I10 Essential (primary) hypertension: Secondary | ICD-10-CM

## 2012-03-09 DIAGNOSIS — R197 Diarrhea, unspecified: Secondary | ICD-10-CM

## 2012-03-09 MED ORDER — DIPHENOXYLATE-ATROPINE 2.5-0.025 MG PO TABS: 1.0000 | ORAL_TABLET | Freq: Four times a day (QID) | ORAL | Status: DC | PRN

## 2012-03-09 MED ORDER — LISINOPRIL 10 MG PO TABS: 10.0000 mg | ORAL_TABLET | Freq: Every day | ORAL | Status: DC
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
## 2012-03-09 NOTE — Patient Instructions (Addendum)
Diarrhea - full work up revealed a mild inflammation of the colon. You were started on antibiotics (cipro and Flagyl) for possible infection although you had no temperature, a normal white blood count and no blood in the stool.   Plan  Hydrate - 8 oz of fluid (gator aide, apple juice, water - no caffeine) for every loose stool plus your usual fluid intake  Continue the Cipro and Flagyl until gone.  May take lomotil 2 when you fill the prescription than 1 after each loose stool with a maximum of 4 doses/; 24 hrs   Diarrhea Infections caused by germs (bacterial) or a virus commonly cause diarrhea. Your caregiver has determined that with time, rest and fluids, the diarrhea should improve. In general, eat normally while drinking more water than usual. Although water may prevent dehydration, it does not contain salt and minerals (electrolytes). Broths, weak tea without caffeine and oral rehydration solutions (ORS) replace fluids and electrolytes. Small amounts of fluids should be taken frequently. Large amounts at one time may not be tolerated. Plain water may be harmful in infants and the elderly. Oral rehydrating solutions (ORS) are available at pharmacies and grocery stores. ORS replace water and important electrolytes in proper proportions. Sports drinks are not as effective as ORS and may be harmful due to sugars worsening diarrhea.  ORS is especially recommended for use in children with diarrhea. As a general guideline for children, replace any new fluid losses from diarrhea and/or vomiting with ORS as follows:   If your child weighs 22 pounds or under (10 kg or less), give 60-120 mL ( -  cup or 2 - 4 ounces) of ORS for each episode of diarrheal stool or vomiting episode.   If your child weighs more than 22 pounds (more than 10 kgs), give 120-240 mL ( - 1 cup or 4 - 8 ounces) of ORS for each diarrheal stool or episode of vomiting.   While correcting for dehydration, children should eat normally.  However, foods high in sugar should be avoided because this may worsen diarrhea. Large amounts of carbonated soft drinks, juice, gelatin desserts and other highly sugared drinks should be avoided.   After correction of dehydration, other liquids that are appealing to the child may be added. Children should drink small amounts of fluids frequently and fluids should be increased as tolerated. Children should drink enough fluids to keep urine clear or pale yellow.   Adults should eat normally while drinking more fluids than usual. Drink small amounts of fluids frequently and increase as tolerated. Drink enough fluids to keep urine clear or pale yellow. Broths, weak decaffeinated tea, lemon lime soft drinks (allowed to go flat) and ORS replace fluids and electrolytes.   Avoid:   Carbonated drinks.   Juice.   Extremely hot or cold fluids.   Caffeine drinks.   Fatty, greasy foods.   Alcohol.   Tobacco.   Too much intake of anything at one time.   Gelatin desserts.   Probiotics are active cultures of beneficial bacteria. They may lessen the amount and number of diarrheal stools in adults. Probiotics can be found in yogurt with active cultures and in supplements.   Wash hands well to avoid spreading bacteria and virus.   Anti-diarrheal medications are not recommended for infants and children.   Only take over-the-counter or prescription medicines for pain, discomfort or fever as directed by your caregiver. Do not give aspirin to children because it may cause Reye's Syndrome.   For adults,  ask your caregiver if you should continue all prescribed and over-the-counter medicines.   If your caregiver has given you a follow-up appointment, it is very important to keep that appointment. Not keeping the appointment could result in a chronic or permanent injury, and disability. If there is any problem keeping the appointment, you must call back to this facility for assistance.  SEEK IMMEDIATE  MEDICAL CARE IF:    You or your child is unable to keep fluids down or other symptoms or problems become worse in spite of treatment.   Vomiting or diarrhea develops and becomes persistent.   There is vomiting of blood or bile (green material).   There is blood in the stool or the stools are black and tarry.   There is no urine output in 6-8 hours or there is only a small amount of very dark urine.   Abdominal pain develops, increases or localizes.   You have a fever.   Your baby is older than 3 months with a rectal temperature of 102 F (38.9 C) or higher.   Your baby is 62 months old or younger with a rectal temperature of 100.4 F (38 C) or higher.   You or your child develops excessive weakness, dizziness, fainting or extreme thirst.   You or your child develops a rash, stiff neck, severe headache or become irritable or sleepy and difficult to awaken.  MAKE SURE YOU:    Understand these instructions.   Will watch your condition.   Will get help right away if you are not doing well or get worse.  Document Released: 05/24/2002 Document Revised: 05/23/2011 Document Reviewed: 04/10/2009 [facility] Patient Information 2012 [place], [place].  Diet for Diarrhea, Adult Having frequent, runny stools (diarrhea) has many causes. Diarrhea may be caused or worsened by food or drink. Diarrhea may be relieved by changing your diet. IF YOU ARE NOT TOLERATING SOLID FOODS:  Drink enough water and fluids to keep your urine clear or pale yellow.   Avoid sugary drinks and sodas as well as milk-based beverages.   Avoid beverages containing caffeine and alcohol.   You may try rehydrating beverages. You can make your own by following this recipe:    tsp table salt.    tsp baking soda.   ? tsp salt substitute (potassium chloride).   1 tbs + 1 tsp sugar.   1 qt water.  As your stools become more solid, you can start eating solid foods. Add foods one at a time. If a certain food causes  your diarrhea to get worse, avoid that food and try other foods. A low fiber, low-fat, and lactose-free diet is recommended. Small, frequent meals may be better tolerated.   Starches  Allowed:  White, [place], and pita breads, plain rolls, buns, bagels. Plain muffins, matzo. Soda, saltine, or graham crackers. Pretzels, melba toast, zwieback. Cooked cereals made with water: cornmeal, farina, cream cereals. Dry cereals: refined corn, wheat, rice. Potatoes prepared any way without skins, refined macaroni, spaghetti, noodles, refined rice.   Avoid:  Bread, rolls, or crackers made with whole wheat, multi-grains, rye, bran seeds, nuts, or coconut. Corn tortillas or taco shells. Cereals containing whole grains, multi-grains, bran, coconut, nuts, or raisins. Cooked or dry oatmeal. Coarse wheat cereals, granola. Cereals advertised as "high-fiber." Potato skins. Whole grain pasta, wild or brown rice. Popcorn. Sweet potatoes/yams. Sweet rolls, doughnuts, waffles, pancakes, sweet breads.  Vegetables  Allowed: Strained tomato and vegetable juices. Most well-cooked and canned vegetables without seeds. Fresh: Tender lettuce,  cucumber without the skin, cabbage, spinach, bean sprouts.   Avoid: Fresh, cooked, or canned: Artichokes, baked beans, beet greens, broccoli, Brussels sprouts, corn, kale, legumes, peas, sweet potatoes. Cooked: Green or red cabbage, spinach. Avoid large servings of any vegetables, because vegetables shrink when cooked, and they contain more fiber per serving than fresh vegetables.  Fruit  Allowed: All fruit juices except prune juice. Cooked or canned: Apricots, applesauce, cantaloupe, cherries, fruit cocktail, grapefruit, grapes, kiwi, mandarin oranges, peaches, pears, plums, watermelon. Fresh: Apples without skin, ripe banana, grapes, cantaloupe, cherries, grapefruit, peaches, oranges, plums. Keep servings limited to  cup or 1 piece.   Avoid: Fresh: Apple with skin, apricots, mango, pears,  raspberries, strawberries. Prune juice, stewed or dried prunes. Dried fruits, raisins, dates. Large servings of all fresh fruits.  Meat and Meat Substitutes  Allowed: Ground or well-cooked tender beef, ham, veal, lamb, pork, or poultry. Eggs, plain cheese. Fish, oysters, shrimp, lobster, other seafoods. Liver, organ meats.   Avoid: Tough, fibrous meats with gristle. Peanut butter, smooth or chunky. Cheese, nuts, seeds, legumes, dried peas, beans, lentils.  Milk  Allowed: Yogurt, lactose-free milk, kefir, drinkable yogurt, buttermilk, soy milk.   Avoid: Milk, chocolate milk, beverages made with milk, such as milk shakes.  Soups  Allowed: Bouillon, broth, or soups made from allowed foods. Any strained soup.   Avoid: Soups made from vegetables that are not allowed, cream or milk-based soups.  Desserts and Sweets  Allowed: Sugar-free gelatin, sugar-free frozen ice pops made without sugar alcohol.   Avoid: Plain cakes and cookies, pie made with allowed fruit, pudding, custard, cream pie. Gelatin, fruit, ice, sherbet, frozen ice pops. Ice cream, ice milk without nuts. Plain hard candy, honey, jelly, molasses, syrup, sugar, chocolate syrup, gumdrops, marshmallows.  Fats and Oils  Allowed: Avoid any fats and oils.   Avoid: Seeds, nuts, olives, avocados. Margarine, butter, cream, mayonnaise, salad oils, plain salad dressings made from allowed foods. Plain gravy, crisp bacon without rind.  Beverages  Allowed: Water, decaffeinated teas, oral rehydration solutions, sugar-free beverages.   Avoid: Fruit juices, caffeinated beverages (coffee, tea, soda or pop), alcohol, sports drinks, or lemon-lime soda or pop.  Condiments  Allowed: Ketchup, mustard, horseradish, vinegar, cream sauce, cheese sauce, cocoa powder. Spices in moderation: allspice, basil, bay leaves, celery powder or leaves, cinnamon, cumin powder, curry powder, ginger, mace, marjoram, onion or garlic powder, oregano, paprika, parsley  flakes, ground pepper, rosemary, sage, savory, tarragon, thyme, turmeric.   Avoid: Coconut, honey.  Weight Monitoring: Weigh yourself every day. You should weigh yourself in the morning after you urinate and before you eat breakfast. Wear the same amount of clothing when you weigh yourself. Record your weight daily. Bring your recorded weights to your clinic visits. Tell your caregiver right away if you have gained 3 lb/1.4 kg or more in 1 day, 5 lb/2.3 kg in a week, or whatever amount you were told to report. SEEK IMMEDIATE MEDICAL CARE IF:    You are unable to keep fluids down.   You start to throw up (vomit) or diarrhea keeps coming back (persistent).   Abdominal pain develops, increases, or can be felt in one place (localizes).   You have an oral temperature above 102 F (38.9 C), not controlled by medicine.   Diarrhea contains blood or mucus.   You develop excessive weakness, dizziness, fainting, or extreme thirst.  MAKE SURE YOU:    Understand these instructions.   Will watch your condition.   Will get help right away if  you are not doing well or get worse.  Document Released: 08/24/2003 Document Revised: 05/23/2011 Document Reviewed: 12/15/2008 [facility] Patient Information 2012 [place], [place].

## 2012-03-10 NOTE — Assessment & Plan Note (Signed)
BP Readings from Last 3 Encounters:  03/09/12 122/90  03/07/12 122/75  11/06/11 168/81   Good control

## 2012-03-16 ENCOUNTER — Telehealth: Payer: Self-pay | Admitting: Internal Medicine

## 2012-03-16 NOTE — Telephone Encounter (Signed)
Caller: [name]/Patient; Patient Name: Hannah Conley; PCP: Illene Regulus (Adults only); Best Callback Phone Number: [phone]  03-15-12 she was advised to go to Emergency Department but she is calling today because she did not go, she states she has been having this diarrhea since 03-01-12 and when she has gone to hospital they have done nothing for her  She said everything she tries to eat goes straight through her.  She is having abdominal bloating and pain.  She said stool today is more brown than black in color.  Refusing to go back to hospital   Per Diarrhea or Other Change in Bowel Habits  emergent symptom of severe pain/cramping in abdomen or rectum that interferes with ability to carry out normal activities or sleep identified.   Since refusing hospital needs to be worked in today please

## 2012-03-17 NOTE — Telephone Encounter (Signed)
Patient refused apt stated "she does not want to keep coming in if we can not fix her", explained she needed apt for evaluation and still refused

## 2012-03-18 ENCOUNTER — Other Ambulatory Visit (INDEPENDENT_AMBULATORY_CARE_PROVIDER_SITE_OTHER): Payer: Federal, State, Local not specified - PPO

## 2012-03-18 ENCOUNTER — Ambulatory Visit (INDEPENDENT_AMBULATORY_CARE_PROVIDER_SITE_OTHER): Payer: Federal, State, Local not specified - PPO | Admitting: Internal Medicine

## 2012-03-18 ENCOUNTER — Encounter: Payer: Self-pay | Admitting: Internal Medicine

## 2012-03-18 VITALS — BP 132/80 | HR 71 | Temp 98.6°F | Resp 16 | Wt 182.0 lb

## 2012-03-18 DIAGNOSIS — R197 Diarrhea, unspecified: Secondary | ICD-10-CM

## 2012-03-18 LAB — CBC WITH DIFFERENTIAL/PLATELET
Basophils Relative: 0.5 % (ref 0.0–3.0)
Eosinophils Absolute: 0.1 10*3/uL (ref 0.0–0.7)
Hemoglobin: 12.7 g/dL (ref 12.0–15.0)
MCHC: 33.1 g/dL (ref 30.0–36.0)
MCV: 91.3 fl (ref 78.0–100.0)
Monocytes Absolute: 0.5 10*3/uL (ref 0.1–1.0)
Neutro Abs: 2.3 10*3/uL (ref 1.4–7.7)
RBC: 4.21 Mil/uL (ref 3.87–5.11)

## 2012-03-18 NOTE — Patient Instructions (Addendum)
Persistent bloody diarrhea raises the possibility of a "colitis" an inflammation of the colon causing your symptoms. There is also a concern for having lost enough blood to feel weak.  Plan Lab today to check your blood count  Appointment tomorrow with Sondra Come, PA in the GI department, 3rd floor of this building.

## 2012-03-18 NOTE — Progress Notes (Signed)
  Subjective:    Patient ID: Hannah Conley, female    DOB: 1965-09-26, 46 y.o.   MRN: [id number]  HPI Hannah Conley returns for persistent bloody diarrhea, abdominal pain, some Upper GI pain and weakness. See previous note and ED evaluation  PMH, FamHx and SocHx reviewed for any changes and relevance.\ Current Outpatient Prescriptions on File Prior to Visit  Medication Sig Dispense Refill  . Calcium Carbonate-Vitamin D (CALCIUM-VITAMIN D) 500-200 MG-UNIT per tablet Take 1 tablet by mouth daily.        . ciprofloxacin (CIPRO) 500 MG tablet Take 1 tablet (500 mg total) by mouth every 12 (twelve) hours.  10 tablet  0  . diphenoxylate-atropine (LOMOTIL) 2.5-0.025 MG per tablet Take 1 tablet by mouth 4 (four) times daily as needed for diarrhea or loose stools. See directions on AVS  30 tablet  0  . ibuprofen (ADVIL,MOTRIN) 800 MG tablet Take 800 mg by mouth every 8 (eight) hours as needed. PAIN      . lidocaine-hydrocortisone (ANAMANTEL HC) 3-0.5 % CREA APPLY TWO TIMES A DAY AS NEEDED FOR HEMORRHOIDS  56.7 g  1  . Lidocaine-Hydrocortisone Ace 3-0.5 % CREA Apply topically.        Marland Kitchen lisinopril (PRINIVIL,ZESTRIL) 10 MG tablet Take 1 tablet (10 mg total) by mouth daily.  30 tablet  10  . metroNIDAZOLE (FLAGYL) 500 MG tablet Take 1 tablet (500 mg total) by mouth 2 (two) times daily.  14 tablet  0  . nebivolol (BYSTOLIC) 10 MG tablet Take 1 tablet (10 mg total) by mouth daily.  30 tablet  6  . oxyCODONE-acetaminophen (PERCOCET/ROXICET) 5-325 MG per tablet Take 1 tablet by mouth every 6 (six) hours as needed for pain.  15 tablet  0  . promethazine (PHENERGAN) 25 MG tablet Take 1 tablet (25 mg total) by mouth every 6 (six) hours as needed for nausea.  10 tablet  0      Review of Systems System review is negative for any constitutional, cardiac, pulmonary, GI or neuro symptoms or complaints other than as described in the HPI.     Objective:   Physical Exam Filed Vitals:   03/18/12 1348  BP: 132/80   Pulse: 71  Temp: 98.6 F (37 C)  Resp: 16   Gen'l- WNWD AA woman in no acute distress Cor- RRR Pulm - normal respirations.       Assessment & Plan:  1. Diarrhea with blood - persistent symptoms with persistent bloody stools. Concern for colitis with subsequent anemia  Plan CBC to rule out acute anemia  GI referral

## 2012-03-19 ENCOUNTER — Encounter: Payer: Self-pay | Admitting: Gastroenterology

## 2012-03-19 ENCOUNTER — Encounter: Payer: Self-pay | Admitting: Physician Assistant

## 2012-03-19 ENCOUNTER — Ambulatory Visit (INDEPENDENT_AMBULATORY_CARE_PROVIDER_SITE_OTHER): Payer: Federal, State, Local not specified - PPO | Admitting: Physician Assistant

## 2012-03-19 VITALS — BP 130/86 | HR 80 | Ht 65.0 in | Wt 181.6 lb

## 2012-03-19 DIAGNOSIS — R197 Diarrhea, unspecified: Secondary | ICD-10-CM

## 2012-03-19 DIAGNOSIS — R110 Nausea: Secondary | ICD-10-CM

## 2012-03-19 DIAGNOSIS — K625 Hemorrhage of anus and rectum: Secondary | ICD-10-CM

## 2012-03-19 MED ORDER — DIPHENOXYLATE-ATROPINE 2.5-0.025 MG PO TABS: 1.0000 | ORAL_TABLET | Freq: Four times a day (QID) | ORAL | Status: DC | PRN

## 2012-03-19 MED ORDER — DICYCLOMINE HCL 10 MG PO CAPS: ORAL_CAPSULE | ORAL | Status: DC

## 2012-03-19 MED ORDER — NA SULFATE-K SULFATE-MG SULF 17.5-3.13-1.6 GM/177ML PO SOLN: 1.0000 | Freq: Once | ORAL | Status: DC

## 2012-03-19 NOTE — Progress Notes (Signed)
Subjective:    Patient ID: Hannah Conley, female    DOB: Jul 29, 1965, 46 y.o.   MRN: [id number]  HPI Hannah Conley is a 46 year old female referred today to GI by Dr. Debby Bud. She has not had any prior GI evaluation. Patient relates acute onset of nausea abdominal pain followed by profuse diarrhea in mid September 2013. She says she had severe diarrhea that lasted for about a week with watery bowel movements every 30-40 minutes no documented fever or chills, she relates nausea and vomiting for the first couple of days of her illness. He was eventually referred to the emergency room was seen there on 03/07/2012. Patient relates that she's unhappy with that visit because they didn't really do anything for her, and did not treat her diarrhea. Apparently she did receive IV fluids, had labs done and also a CT scan of the abdomen and pelvis it showed no evidence of obstruction she had nonspecific mild thickening of the cecal wall and also intermittent mild focal thickening of the transverse colon and descending colon. Colitis could not be excluded. She was treated empirically with a course of Cipro and Flagyl for what sounds like 10 days and says she completed this. She was seen by Dr. Arna Medici and on 03/10/2012 at that time her diarrhea had improved but was not resolved. He added Lomotil to her regimen and advice she finished the course of Cipro and Flagyl. She called back and stated she was still filling second was referred here. She denies any recent fever or chills has been having some occasional sweats headaches and persistent intermittent nausea. She is able to eat has had no further vomiting over the past 2 weeks. She is currently having 3-4 bowel movements per day which her diarrheal usually postprandially and she states that she has seen bright red blood mixed in with all of her bowel movements. She's also still hurting in her abdomen, she says rather generalized discomfort. He denies any rectal pain or discomfort  and has not had any prior problems with hemorrhoids. She is distressed because she still hurting and having some rectal bleeding. Family history is negative for GI disease as far she is aware, no other family members have been ill and had any other infectious exposures that she's aware of, no recent antibiotics other than the Cipro and Flagyl and no new medications or medication changes. CBC was done yesterday shows a WBC of 5.3 hemoglobin 12.7 hematocrit of 38.4    Review of Systems  Constitutional: Negative.   HENT: Negative.   Eyes: Negative.   Respiratory: Negative.   Cardiovascular: Negative.   Gastrointestinal: Positive for nausea, abdominal pain, diarrhea and blood in stool.  Genitourinary: Negative.   Musculoskeletal: Negative.   Skin: Negative.   Neurological: Negative.   Hematological: Negative.   Psychiatric/Behavioral: Negative.    Outpatient Prescriptions Prior to Visit  Medication Sig Dispense Refill  . Calcium Carbonate-Vitamin D (CALCIUM-VITAMIN D) 500-200 MG-UNIT per tablet Take 1 tablet by mouth daily.        . ciprofloxacin (CIPRO) 500 MG tablet Take 1 tablet (500 mg total) by mouth every 12 (twelve) hours.  10 tablet  0  . ibuprofen (ADVIL,MOTRIN) 800 MG tablet Take 800 mg by mouth every 8 (eight) hours as needed. PAIN      . lidocaine-hydrocortisone (ANAMANTEL HC) 3-0.5 % CREA APPLY TWO TIMES A DAY AS NEEDED FOR HEMORRHOIDS  56.7 g  1  . Lidocaine-Hydrocortisone Ace 3-0.5 % CREA Apply topically.        Marland Kitchen  lisinopril (PRINIVIL,ZESTRIL) 10 MG tablet Take 1 tablet (10 mg total) by mouth daily.  30 tablet  10  . metroNIDAZOLE (FLAGYL) 500 MG tablet Take 1 tablet (500 mg total) by mouth 2 (two) times daily.  14 tablet  0  . nebivolol (BYSTOLIC) 10 MG tablet Take 1 tablet (10 mg total) by mouth daily.  30 tablet  6  . oxyCODONE-acetaminophen (PERCOCET/ROXICET) 5-325 MG per tablet Take 1 tablet by mouth every 6 (six) hours as needed for pain.  15 tablet  0  . promethazine  (PHENERGAN) 25 MG tablet Take 1 tablet (25 mg total) by mouth every 6 (six) hours as needed for nausea.  10 tablet  0  . diphenoxylate-atropine (LOMOTIL) 2.5-0.025 MG per tablet Take 1 tablet by mouth 4 (four) times daily as needed for diarrhea or loose stools. See directions on AVS  30 tablet  0   Allergies  Allergen Reactions  . Hydrocodone     REACTION: Hallucinations  . Penicillins Other (See Comments)    DOESN'T REMEMBER    Patient Active Problem List  Diagnosis  . HYPOKALEMIC PERIODIC PARALYSIS  . HYPERTENSION, SEVERE  . VASOMOTOR RHINITIS  . INSOMNIA  . Bruising  . Fibromyalgia syndrome   History   Social History  . Marital Status: Single    Spouse Name: N/A    Number of Children: 2  . Years of Education: N/A   Occupational History  .     Social History Main Topics  . Smoking status: Never Smoker   . Smokeless tobacco: Never Used  . Alcohol Use: No  . Drug Use: No  . Sexually Active: Not on file   Other Topics Concern  . Not on file   Social History Narrative   [place], [facility] years [employer] [place] '85, '89; 3 grandchildren       Objective:   Physical Exam well-developed AA female in no acute distress. Blood pressure 130/86 pulse 80 height 5 foot 5 weight 181. HEENT; nontraumatic, normocephalic, EOMI PERRLA sclera anicteric,Neck; supple no JVD, Cardiovascular regular rate and rhythm with S1-S2 no murmur or gallop, Pulm; clear bilaterally, Abdomen; soft ,BS+. sounds are active she is tender in the right upper quadrant right mid quadrant or right lower quadrant no guarding or rebound no palpable mass or hepatosplenomegaly, Rectal; exam not done, Extremities; no clubbing cyanosis or edema skin warm and dry, Psych; mood and affect appropriate.        Assessment & Plan:  #1 46 yo old female with a three-week illness initially with nausea vomiting abdominal pain and profuse watery diarrhea treated empirically with Cipro and Flagyl for an infectious  colitis. Patient with persistent complaints of abdominal pain , bloody diarrhea. Rule out infectious colitis versus IBD #2 hypertension #3 fibromyalgia  Plan; check stool for C. difficile PCR today Continue Lomotil up to 4 daily as needed for diarrhea, refill given Add Bentyl 10 mg by mouth twice daily as needed for abdominal pain and cramping Schedule for colonoscopy with Dr. Arlyce Dice, procedure was discussed in detail with the patient and she is agreeable to proceed.

## 2012-03-19 NOTE — Patient Instructions (Addendum)
Please go to the basement level to the lab for the stool study. We scheduled the Colonoscopy with Dr Arlyce Dice for 10-15 2013. Directions and brochure provided. We have given you the sample for the Suprep, the prep you will be drinking.  We have given you a prescription refill for Lomotil to take to your pharmacy. We sent a prescription for Dicyclomine ( Bentyl)  to [facility] [address]

## 2012-03-20 ENCOUNTER — Other Ambulatory Visit: Payer: Federal, State, Local not specified - PPO

## 2012-03-20 DIAGNOSIS — R197 Diarrhea, unspecified: Secondary | ICD-10-CM

## 2012-03-20 DIAGNOSIS — K625 Hemorrhage of anus and rectum: Secondary | ICD-10-CM

## 2012-03-20 NOTE — Progress Notes (Signed)
Reviewed and agree with management. Jessicaann Overbaugh D. Osmara Drummonds, M.D., FACG  

## 2012-03-30 ENCOUNTER — Telehealth: Payer: Self-pay | Admitting: Gastroenterology

## 2012-03-30 ENCOUNTER — Encounter: Payer: Self-pay | Admitting: *Deleted

## 2012-03-30 NOTE — Telephone Encounter (Signed)
Returned pts call.  Went through instructions with her for her bowel prep.  She verbalized understanding of them.  Will call back with any additional questions.

## 2012-03-31 ENCOUNTER — Ambulatory Visit (AMBULATORY_SURGERY_CENTER): Payer: Federal, State, Local not specified - PPO | Admitting: Gastroenterology

## 2012-03-31 ENCOUNTER — Telehealth: Payer: Self-pay | Admitting: Gastroenterology

## 2012-03-31 ENCOUNTER — Encounter: Payer: Self-pay | Admitting: Gastroenterology

## 2012-03-31 VITALS — BP 177/112 | HR 64 | Temp 98.0°F | Resp 22 | Ht 65.0 in | Wt 181.0 lb

## 2012-03-31 DIAGNOSIS — K625 Hemorrhage of anus and rectum: Secondary | ICD-10-CM

## 2012-03-31 DIAGNOSIS — D126 Benign neoplasm of colon, unspecified: Secondary | ICD-10-CM

## 2012-03-31 DIAGNOSIS — K648 Other hemorrhoids: Secondary | ICD-10-CM

## 2012-03-31 DIAGNOSIS — R110 Nausea: Secondary | ICD-10-CM

## 2012-03-31 DIAGNOSIS — R197 Diarrhea, unspecified: Secondary | ICD-10-CM

## 2012-03-31 LAB — GLUCOSE, CAPILLARY: Glucose-Capillary: 76 mg/dL (ref 70–99)

## 2012-03-31 MED ORDER — SODIUM CHLORIDE 0.9 % IV SOLN: 500.0000 mL | INTRAVENOUS | Status: DC

## 2012-03-31 NOTE — Patient Instructions (Addendum)
Impressions/recommendations:  Internal hemorrhoids (handout given)  Call office to schedule an appointment for in 2 weeks. Continue current medications.  Dr. Arlyce Dice is aware of your blood pressure, heart rate and rhythm, blood sugars, abdominal pain and dizziness. He would like for you to go home and relax, continue to try to pass air and if you are still feeling the way you report in 2 hours, report to your nearest emergency room.  Take Align once daily for 2 weeks.  YOU HAD AN ENDOSCOPIC PROCEDURE TODAY AT THE [facility]: Refer to the procedure report that was given to you for any specific questions about what was found during the examination.  If the procedure report does not answer your questions, please call your gastroenterologist to clarify.  If you requested that your care partner not be given the details of your procedure findings, then the procedure report has been included in a sealed envelope for you to review at your convenience later.  YOU SHOULD EXPECT: Some feelings of bloating in the abdomen. Passage of more gas than usual.  Walking can help get rid of the air that was put into your GI tract during the procedure and reduce the bloating. If you had a lower endoscopy (such as a colonoscopy or flexible sigmoidoscopy) you may notice spotting of blood in your stool or on the toilet paper. If you underwent a bowel prep for your procedure, then you may not have a normal bowel movement for a few days.  DIET: Your first meal following the procedure should be a light meal and then it is ok to progress to your normal diet.  A half-sandwich or bowl of soup is an example of a good first meal.  Heavy or fried foods are harder to digest and may make you feel nauseous or bloated.  Likewise meals heavy in dairy and vegetables can cause extra gas to form and this can also increase the bloating.  Drink plenty of fluids but you should avoid alcoholic beverages for 24 hours.  ACTIVITY:  Your care partner should take you home directly after the procedure.  You should plan to take it easy, moving slowly for the rest of the day.  You can resume normal activity the day after the procedure however you should NOT DRIVE or use heavy machinery for 24 hours (because of the sedation medicines used during the test).    SYMPTOMS TO REPORT IMMEDIATELY: A gastroenterologist can be reached at any hour.  During normal business hours, 8:30 AM to 5:00 PM Monday through Friday, call [phone].  After hours and on weekends, please call the GI answering service at [phone] who will take a message and have the physician on call contact you.   Following lower endoscopy (colonoscopy or flexible sigmoidoscopy):  Excessive amounts of blood in the stool  Significant tenderness or worsening of abdominal pains  Swelling of the abdomen that is new, acute  Fever of 100F or higher   FOLLOW UP: If any biopsies were taken you will be contacted by phone or by letter within the next 1-3 weeks.  Call your gastroenterologist if you have not heard about the biopsies in 3 weeks.  Our staff will call the home number listed on your records the next business day following your procedure to check on you and address any questions or concerns that you may have at that time regarding the information given to you following your procedure. This is a courtesy call and so if there  is no answer at the home number and we have not heard from you through the emergency physician on call, we will assume that you have returned to your regular daily activities without incident.  SIGNATURES/CONFIDENTIALITY: You and/or your care partner have signed paperwork which will be entered into your electronic medical record.  These signatures attest to the fact that that the information above on your After Visit Summary has been reviewed and is understood.  Full responsibility of the confidentiality of this discharge information lies  with you and/or your care-partner.

## 2012-03-31 NOTE — Op Note (Signed)
[facility] [address] [place], [address]   COLONOSCOPY PROCEDURE REPORT  PATIENT: Hannah Conley, Hannah Conley  MR#: [id number] BIRTHDATE: 11-05-1965 , 46  yrs. old GENDER: Female ENDOSCOPIST: Louis Meckel, MD REFERRED [id number] Esther Hardy, M.D. PROCEDURE DATE:  03/31/2012 PROCEDURE:   Colonoscopy with biopsy ASA CLASS:   Class II INDICATIONS:unexplained diarrhea. MEDICATIONS: MAC sedation, administered by CRNA and propofol (Diprivan) 300mg  IV  DESCRIPTION OF PROCEDURE:   After the risks benefits and alternatives of the procedure were thoroughly explained, informed consent was obtained.  A digital rectal exam revealed no abnormalities of the rectum.   The LB [id number]  endoscope was introduced through the anus and advanced to the cecum, which was identified by both the appendix and ileocecal valve. No adverse events experienced.   .  There was a moderate amount of retained liquid stool.   The quality of the prep was Suprep fair  The instrument was then slowly withdrawn as the colon was fully examined.      COLON FINDINGS: Internal hemorrhoids were found.   The colon mucosa was otherwise normal.   Random biopsies were taken throughout the colon to rule out microscopic colitis.   Random biopsies were taken throughout the colon to rule out microscopic colitis.  Retroflexed views revealed no abnormalities. The time to cecum=4 minutes 38 seconds.  Withdrawal time=6 minutes 18 seconds.  The scope was withdrawn and the procedure completed. COMPLICATIONS: There were no complications.  ENDOSCOPIC IMPRESSION: 1.   Internal hemorrhoids 2.   The colon mucosa was otherwise normal  Limited rectal bleeding likely secondary to internal hemorrhoids   RECOMMENDATIONS: 1.  await biopsy results 2.  Call to schedule a follow-up appointment with office 2 week(s) 3.  continue current medications   eSigned:  Louis Meckel, MD 03/31/2012 4:30  PM   cc:

## 2012-03-31 NOTE — Progress Notes (Signed)
Propofol per Rosemarie Beath, CRNA. See scanned intra procedure report. ewm

## 2012-03-31 NOTE — Progress Notes (Addendum)
[place] patient out for discharge and she is holding her head and complaining of her head hurting...12 on a scale of 0-10. Phoned Dr. Arlyce Dice and orders received to transfer her to [facility] by ambulance. Placed pt back in bed, nsr rate in the 60's, bp 145/77, sats 99, skin warm and dry of normal color. Does not appear to be in distress but continues to have many complaints. EMS dispatched. Report called to [place] at [facility] ER. When she was questioned by Minerva Ends about taking blood pressure medicine she reported earlier when bp's up that she had taken bp medicine, while laying here waiting for ambulance she reported to care partner that she did not take her blood pressure medicine last evening. Pt is aware that she is being transported to [facility] ER for full evaluation of complaints.

## 2012-03-31 NOTE — Progress Notes (Signed)
Pt looking at phone, unable to pay attention to further discharge instructions per Dr. Arlyce Dice. Laughing and talking about all this foolishness.

## 2012-03-31 NOTE — Telephone Encounter (Signed)
Pt was feeling light headed when she left [facility].  I called and left message with patient.  Patient called back stating that she is feeling fine and all she needs to do is to eat and take her bp meds.  She denies abdominal pain.  She refused to go to the ER .  I instructed her to call the office if she had any further difficulties tonight.

## 2012-03-31 NOTE — Progress Notes (Signed)
Pt was without complaints, then spontaneously stated she started feeling "weird", it was noted that she was in bigeminy and a strip was recorded, bp taken shows hypertension. Heart rhythm spontaneously converted back to sinus rhythm. BP remains elevated. Dr. Arlyce Dice notified and he stated just to watch her for a few more minutes. Pt still complaining of feeling "weird", lightheaded..cbg taken 73, 2 4 oz juices given to patient.  Pt up to bathroom as she is afraid to pass gas because she does not like the way it feels. She starts passing air but then stops because it feels  bad.  Repeat blood sugar 76. Two more juices given. Offered peanut butter and crackers but she declined peanut butter as she does not like it.  Phoned Dr. Arlyce Dice again, reported heart rhythmn, blood pressure, blood sugars, Levsin given and patient response to passing air. Orders received to send her home and for her to report to ER if she is still feeling this way in 2 hours.

## 2012-03-31 NOTE — Progress Notes (Signed)
Patient did not experience any of the following events: a burn prior to discharge; a fall within the facility; wrong site/side/patient/procedure/implant event; or a hospital transfer or hospital admission upon discharge from the facility. (G8907) Patient did not have preoperative order for IV antibiotic SSI prophylaxis. (G8918)  

## 2012-04-01 ENCOUNTER — Telehealth: Payer: Self-pay | Admitting: *Deleted

## 2012-04-01 NOTE — Telephone Encounter (Signed)
  Follow up Call-  Call back number 03/31/2012  Post procedure Call Back phone  # [phone]  Permission to leave phone message Yes     Patient questions:  Do you have a fever, pain , or abdominal swelling? no Pain Score  0 *  Have you tolerated food without any problems? yes  Have you been able to return to your normal activities? yes  Do you have any questions about your discharge instructions: Diet   no Medications  no Follow up visit  no  Do you have questions or concerns about your Care? no  Actions: * If pain score is 4 or above: No action needed, pain <4.  Patient denies any pain,fever,swelling or "weird feelings". Patient stating after eating she feels back to normal. Thanked this Clinical research associate for checking on her this am.

## 2012-04-08 ENCOUNTER — Encounter: Payer: Self-pay | Admitting: Gastroenterology

## 2012-04-27 ENCOUNTER — Encounter: Payer: Self-pay | Admitting: Gastroenterology

## 2012-04-27 ENCOUNTER — Ambulatory Visit (INDEPENDENT_AMBULATORY_CARE_PROVIDER_SITE_OTHER): Payer: Federal, State, Local not specified - PPO | Admitting: Gastroenterology

## 2012-04-27 VITALS — BP 128/86 | HR 80 | Ht 64.0 in | Wt 180.4 lb

## 2012-04-27 DIAGNOSIS — K529 Noninfective gastroenteritis and colitis, unspecified: Secondary | ICD-10-CM

## 2012-04-27 DIAGNOSIS — K5289 Other specified noninfective gastroenteritis and colitis: Secondary | ICD-10-CM

## 2012-04-27 NOTE — Patient Instructions (Addendum)
Follow up as needed

## 2012-04-27 NOTE — Progress Notes (Signed)
History of Present Illness:  Hannah Conley returns following colonoscopy. Exam was entirely normal. Random biopsies were negative for inflammatory changes. She reports significant improvement in her symptoms. Except for occasional soreness she has no other GI complaints including spontaneous pain or diarrhea.    Review of Systems: Pertinent positive and negative review of systems were noted in the above HPI section. All other review of systems were otherwise negative.    Current Medications, Allergies, Past Medical History, Past Surgical History, Family History and Social History were reviewed in [facility] electronic medical record  Vital signs were reviewed in today's medical record. Physical Exam: General: Well developed , well nourished, no acute distress

## 2012-04-27 NOTE — Assessment & Plan Note (Signed)
Probable bacterial gastroenteritis with postinfectious diarrhea-now resolved. No evidence for underlying colitis by colonoscopy. Plan no further workup or therapy.

## 2012-07-08 ENCOUNTER — Telehealth: Payer: Self-pay | Admitting: Internal Medicine

## 2012-07-08 NOTE — Telephone Encounter (Signed)
[facility] Triage Call Report Triage Record Num: [id number] Operator: Remonia Richter Patient Name: Hannah Conley Call Date & Time: 07/07/2012 6:33:44PM Patient Phone: [phone] PCP: Illene Regulus Patient Gender: Female PCP Fax : [phone] Patient DOB: Mar 21, 1966 Practice Name: [facility] Reason for Call: Caller: [name]/Patient; PCP: Illene Regulus (Adults only); CB#: [phone]; Call regarding Urinary Pain that started today 07/07/12, afebrile, she has pain up into lower abdomen and "cant stand the pain", asking why she has this again as" she had the same pain a year ago!" Upset and yelling on phone but cooperative, gets depo shots, no menses, afebrile,ED disposition obtained per Abdominal Pain guideline due to Unbearable Pelvic Pain, she was advised to go to ED at [facility] per disposition, she is hesitant due to past experience but agrees to go for eval Protocol(s) Used: Abdominal Pain Protocol(s) Used: Urinary Symptoms - Female Recommended Outcome per Protocol: See ED Immediately Reason for Outcome: Unbearable abdominal/pelvic pain Unbearable abdominal/pelvic pain

## 2012-08-24 ENCOUNTER — Encounter: Payer: Self-pay | Admitting: Internal Medicine

## 2012-08-24 ENCOUNTER — Ambulatory Visit (INDEPENDENT_AMBULATORY_CARE_PROVIDER_SITE_OTHER): Payer: Federal, State, Local not specified - PPO | Admitting: Internal Medicine

## 2012-08-24 VITALS — BP 138/80 | HR 68 | Temp 98.3°F | Wt 178.0 lb

## 2012-08-24 DIAGNOSIS — I10 Essential (primary) hypertension: Secondary | ICD-10-CM

## 2012-08-24 DIAGNOSIS — J069 Acute upper respiratory infection, unspecified: Secondary | ICD-10-CM
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
## 2012-08-24 NOTE — Assessment & Plan Note (Addendum)
Assessment - History and physical exam consistent with URI, there are no signs that would suggest a bacterial etiology pt was properly prophylaxed prior to her dental procedure.  A max temperature of 99.9 F is inconsistent with influenzae infection. Plan - Stay well hydrated, pseudoephedrine 30 mg 2-3x a day prn for congestion, Tylenol 500 mg 3x daily for one week, can use ibuprofen for further pain relief.  Mucinex and vitamin C may also be used prn.

## 2012-08-25 NOTE — Assessment & Plan Note (Signed)
BP Readings from Last 3 Encounters:  08/24/12 138/80  04/27/12 128/86  03/31/12 177/112   Good control.  Plan Continue present medication

## 2012-09-03 ENCOUNTER — Ambulatory Visit (INDEPENDENT_AMBULATORY_CARE_PROVIDER_SITE_OTHER): Payer: Federal, State, Local not specified - PPO | Admitting: Internal Medicine

## 2012-09-03 ENCOUNTER — Encounter: Payer: Self-pay | Admitting: Internal Medicine

## 2012-09-03 VITALS — BP 152/88 | HR 67 | Temp 98.6°F | Resp 16 | Ht 65.0 in | Wt 179.8 lb

## 2012-09-03 DIAGNOSIS — R413 Other amnesia: Secondary | ICD-10-CM

## 2012-09-03 NOTE — Progress Notes (Signed)
  Subjective:    Patient ID: Hannah Conley, female    DOB: [birth year], 47 y.o.   MRN: [id number]  HPI Hannah Conley presents for evaluation of memory loss.   She does report that she has recurrent infections/boil in the vulvar area and is interested in whether she has been tested for diabetes.    Review of Systems     Objective:   Physical Exam Filed Vitals:   09/03/12 1412  BP: 152/88  Pulse: 67  Temp: 98.6 F (37 C)  Resp: 16   MMSE: 1. Day,date,year - ok, ok, ok 2. Content: president-  [facility]. -  ok,  Current events - poor recall of common events 3. Number repitition: 5 fwd -  ok  5 rev - no, 4 rev -ok        World reversed - ok 4. 3 word recall - 2/3 5. Serial 7's -  no    , nickles in $1.25-  ok  Change making -  ok 6. Naming objects -    ok       4 legged creatures-ok 7. Parables:  [place] -  good     Rolling stone -  No (had never heard before) 8. Judgement:  Letter ok         Fire ok 9. Clock face exercise          Assessment & Plan:

## 2012-09-03 NOTE — Patient Instructions (Addendum)
Based on the MMSE test you are doing OK with just a little problem in terms of serial 7's, current events. No need to have any additional testing or consultation and no need for any medication.   Plan - will repeat the same MMSE in 6 months

## 2012-09-07 DIAGNOSIS — R413 Other amnesia: Secondary | ICD-10-CM | POA: Insufficient documentation

## 2012-09-07 NOTE — Assessment & Plan Note (Signed)
MMSE performed well with evidence of slight decline in short term memory, not to a degree that requires medical therapy.  Plan  Follow up MMSE in 6 months.

## 2013-01-11 ENCOUNTER — Telehealth: Payer: Self-pay

## 2013-01-11 MED ORDER — HYDROCHLOROTHIAZIDE 12.5 MG PO CAPS: 12.5000 mg | ORAL_CAPSULE | Freq: Every day | ORAL | Status: DC

## 2013-01-11 NOTE — Telephone Encounter (Signed)
Phone call from patient stating her blood pressure has been elevated x 5 days. Today it is 140/79 but the highest it's been is 171/101 and that was on Saturday. She is taking Bystolic 10 mg daily and Lisinopril 10 mg. She's been very tired. Please advise. thanks

## 2013-01-11 NOTE — Telephone Encounter (Signed)
Continue present medications. Add HCTZ 12.5 mg every AM - a water pill to help bring down the BP. Keep a record of BP readings.

## 2013-01-11 NOTE — Telephone Encounter (Signed)
Patient notified and expressed she will keep track of BP readings.

## 2013-01-15 ENCOUNTER — Telehealth: Payer: Self-pay | Admitting: *Deleted

## 2013-01-15 NOTE — Telephone Encounter (Signed)
[facility] Triage Call Report Triage Record Num: [id number] Operator: Durward Mallard [facility] Patient Name: Hannah Conley Call Date & Time: 01/14/2013 8:03:01PM Patient Phone: [phone] PCP: Illene Regulus Patient Gender: Female PCP Fax : [phone] Patient DOB: November 30, 1965 Practice Name: [facility] Reason for Call: Caller: Annesha/Patient; PCP: Illene Regulus (Adults only); CB#: [phone]; Call regarding High Blood Pressure; sx started 2 weeks ago; called office on 01/11/13 and MD started her on HCTZ 12.5mg  QD in addition to Bystolic 10mg  daily and Lisinopril 10mg  daily; pt was seen today in the lab and BP 142/101 at 5:15pm; BP 140/95 at present time; has a H/A and nauseated; reports that chest hurts when she breaths; Triaged per Hypertension, Diagnosed or Suspected Guideline; Call 911 now due to chest discomfort associated with shortness of breath, nausea and lasting 5 or more minutes now or within last hour; offered to call 911 but pt refuses and states that she will call daughter; [facility] is too far for her; pt will go to nearest hospital but unsure at thistime; instructed if she can not get a hold of daughter call 911; will comply Protocol(s) Used: Hypertension, Diagnosed or Suspected Recommended Outcome per Protocol: Activate EMS 911 Reason for Outcome: Chest discomfort associated with shortness of breath, sweating, odd heartbeats or different heart rate, nausea, vomiting, lightheadedness, or fainting lasting 5 or more minutes now or within the last hour Care Advice: ~ IMMEDIATE ACTION Write down provider's name. List or place the following in a bag for transport with the patient: current prescription and/or nonprescription medications; alternative treatments, therapies and medications; and street drugs. ~ ~ Place person in a position of comfort and loosen tight clothing. After calling EMS 911, have the person chew one aspirin tablet (325 mg), or 4 baby aspirin  (81mg ) with a small amount of water now if conscious, not allergic to aspirin, or if has not been told to avoid taking aspirin by their provider. It is important to use aspirin, not acetaminophen. ~ 07/

## 2013-02-17 ENCOUNTER — Other Ambulatory Visit: Payer: Self-pay | Admitting: Obstetrics and Gynecology

## 2013-02-17 ENCOUNTER — Ambulatory Visit
Admission: RE | Admit: 2013-02-17 | Discharge: 2013-02-17 | Disposition: A | Discharge status: Home / Self Care | Payer: Federal, State, Local not specified - PPO | Source: Ambulatory Visit | Attending: Obstetrics and Gynecology | Admitting: Obstetrics and Gynecology

## 2013-02-17 DIAGNOSIS — N6002 Solitary cyst of left breast: Secondary | ICD-10-CM

## 2013-03-03 ENCOUNTER — Other Ambulatory Visit: Payer: Federal, State, Local not specified - PPO

## 2013-03-04 ENCOUNTER — Ambulatory Visit
Admission: RE | Admit: 2013-03-04 | Discharge: 2013-03-04 | Disposition: A | Discharge status: Home / Self Care | Payer: Federal, State, Local not specified - PPO | Source: Ambulatory Visit | Attending: Obstetrics and Gynecology | Admitting: Obstetrics and Gynecology

## 2013-03-04 DIAGNOSIS — N6002 Solitary cyst of left breast: Secondary | ICD-10-CM

## 2013-03-11 ENCOUNTER — Encounter: Payer: Self-pay | Admitting: Internal Medicine

## 2013-03-11 ENCOUNTER — Ambulatory Visit (INDEPENDENT_AMBULATORY_CARE_PROVIDER_SITE_OTHER): Payer: Federal, State, Local not specified - PPO | Admitting: Internal Medicine

## 2013-03-11 VITALS — BP 158/102 | HR 57 | Temp 99.2°F | Wt 180.0 lb

## 2013-03-11 DIAGNOSIS — I10 Essential (primary) hypertension: Secondary | ICD-10-CM

## 2013-03-11 DIAGNOSIS — G4700 Insomnia, unspecified: Secondary | ICD-10-CM

## 2013-03-11 DIAGNOSIS — R413 Other amnesia: Secondary | ICD-10-CM

## 2013-03-11 MED ORDER — LISINOPRIL 20 MG PO TABS: 20.0000 mg | ORAL_TABLET | Freq: Every day | ORAL | Status: DC

## 2013-03-11 MED ORDER — HYDROCHLOROTHIAZIDE 25 MG PO TABS: 25.0000 mg | ORAL_TABLET | Freq: Every day | ORAL | Status: DC

## 2013-03-11 NOTE — Assessment & Plan Note (Signed)
Blood pressure - several high readings BP Readings from Last 3 Encounters:  03/11/13 158/102  09/03/12 152/88  08/24/12 138/80    Plan  increase HCTZ 25mg   Increase lisinopril to 20 mg once a day  Record your blood pressure and report back the reading.

## 2013-03-11 NOTE — Assessment & Plan Note (Signed)
Sleep - don't know what is waking you up  Plan Be sure you have had exercise, don't watch TV in bed, if you wake up get up out of bed  You can try tylenol PM at bedtime to see if it helps.

## 2013-03-11 NOTE — Assessment & Plan Note (Signed)
Memory - your memory is fine. You did better on the memory test than you did in March. Plan Will recheck in 1 year

## 2013-03-11 NOTE — Patient Instructions (Addendum)
1. Memory - your memory is fine. You did better on the memory test than you did in March. Plan Will recheck in 1 year  2. Blood pressure - several high readings Plan  increase HCTZ 25mg   Increase lisinopril to 20 mg once a day  Record your blood pressure and report back the reading.  3. Sleep - don't know what is waking you up Plan Be sure you have had exercise, don't watch TV in bed, if you wake up get up out of bed  You can try tylenol PM at bedtime to see if it helps.   Congratulations on your promotion.
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
## 2013-04-25 ENCOUNTER — Other Ambulatory Visit: Payer: Self-pay | Admitting: Internal Medicine

## 2013-04-26 NOTE — Telephone Encounter (Signed)
Do not see this on current med list. Please advise.

## 2013-04-27 ENCOUNTER — Other Ambulatory Visit: Payer: Self-pay | Admitting: Internal Medicine

## 2013-05-10 ENCOUNTER — Encounter: Payer: Self-pay | Admitting: Internal Medicine

## 2013-05-10 ENCOUNTER — Other Ambulatory Visit: Payer: Self-pay | Admitting: Internal Medicine

## 2013-05-10 DIAGNOSIS — I10 Essential (primary) hypertension: Secondary | ICD-10-CM

## 2013-05-11 ENCOUNTER — Telehealth: Payer: Self-pay | Admitting: Internal Medicine

## 2013-05-11 NOTE — Telephone Encounter (Signed)
Ok. Today looks fullish, tomorrow may be better. Either day is ok

## 2013-05-11 NOTE — Telephone Encounter (Signed)
Appt Wed. At 4:30.

## 2013-05-11 NOTE — Telephone Encounter (Signed)
Pt is requesting an appt with Dr. Debby Bud.  She has been informed that there is a referral.  She has a head ache and her bp is 161/102.  When she drinks tea or juice it tastes like lead.  [phone]

## 2013-05-12 ENCOUNTER — Ambulatory Visit (INDEPENDENT_AMBULATORY_CARE_PROVIDER_SITE_OTHER): Payer: Federal, State, Local not specified - PPO | Admitting: Internal Medicine

## 2013-05-12 ENCOUNTER — Encounter: Payer: Self-pay | Admitting: Internal Medicine

## 2013-05-12 VITALS — BP 156/90 | HR 63 | Temp 98.8°F | Wt 184.0 lb

## 2013-05-12 DIAGNOSIS — R51 Headache: Secondary | ICD-10-CM

## 2013-05-12 DIAGNOSIS — I10 Essential (primary) hypertension: Secondary | ICD-10-CM

## 2013-05-12 NOTE — Patient Instructions (Signed)
1) Headache: Your headache is consistent with the symptoms caused by a muscle tension headache. Tensions headaches are due to the clenching of the muscles of your head leading to the pain you are experiencing. These headaches can frequently be chronic. It is ok to take 800 mg motrin(ibuprofen) up to 3 times/24 hrs if needed.  To see if you have a migraine type headache you will receive relpax to see if it aborts your symptoms. When you realize a headache is coming on (feeling nauseated, blurred vision, food is tasting funny), take this medication. If this drug is effective at stopping your headache, your headaches may be due to migraines.   If your headaches persist, we will refer you to a headache specialist.   2) High blood pressure: Your blood pressure has probably been elevated the past few weeks due to your headache. Pain can frequently elevate your blood pressure. We will not adjust your blood pressure medications at this time-continue to take them as you have been.      Tension Headache A tension headache is a feeling of pain, pressure, or aching often felt over the front and sides of the head. The pain can be dull or can feel tight (constricting). It is the most common type of headache. Tension headaches are not normally associated with nausea or vomiting and do not get worse with physical activity. Tension headaches can last 30 minutes to several days.  CAUSES  The exact cause is not known, but it may be caused by chemicals and hormones in the brain that lead to pain. Tension headaches often begin after stress, anxiety, or depression. Other triggers may include:  Alcohol.  Caffeine (too much or withdrawal).  Respiratory infections (colds, flu, sinus infections).  Dental problems or teeth clenching.  Fatigue.  Holding your head and neck in one position too long while using a computer. SYMPTOMS   Pressure around the head.   Dull, aching head pain.   Pain felt over the front  and sides of the head.   Tenderness in the muscles of the head, neck, and shoulders. DIAGNOSIS  A tension headache is often diagnosed based on:   Symptoms.   Physical examination.   A CT scan or MRI of your head. These tests may be ordered if symptoms are severe or unusual. TREATMENT  Medicines may be given to help relieve symptoms.  HOME CARE INSTRUCTIONS   Only take over-the-counter or prescription medicines for pain or discomfort as directed by your caregiver.   Lie down in a dark, quiet room when you have a headache.   Keep a journal to find out what may be triggering your headaches. For example, write down:  What you eat and drink.  How much sleep you get.  Any change to your diet or medicines.  Try massage or other relaxation techniques.   Ice packs or heat applied to the head and neck can be used. Use these 3 to 4 times per day for 15 to 20 minutes each time, or as needed.   Limit stress.   Sit up straight, and do not tense your muscles.   Quit smoking if you smoke.  Limit alcohol use.  Decrease the amount of caffeine you drink, or stop drinking caffeine.  Eat and exercise regularly.  Get 7 to 9 hours of sleep, or as recommended by your caregiver.  Avoid excessive use of pain medicine as recurrent headaches can occur.  SEEK MEDICAL CARE IF:   You have problems with  the medicines you were prescribed.  Your medicines do not work.  You have a change from the usual headache.  You have nausea or vomiting. SEEK IMMEDIATE MEDICAL CARE IF:   Your headache becomes severe.  You have a fever.  You have a stiff neck.  You have loss of vision.  You have muscular weakness or loss of muscle control.  You lose your balance or have trouble walking.  You feel faint or pass out.  You have severe symptoms that are different from your first symptoms. MAKE SURE YOU:   Understand these instructions.  Will watch your condition.  Will get help  right away if you are not doing well or get worse. Document Released: 06/03/2005 Document Revised: 08/26/2011 Document Reviewed: 05/24/2011 [facility] Patient Information 2014 [place], [place].     Migraine Headache A migraine headache is an intense, throbbing pain on one or both sides of your head. A migraine can last for 30 minutes to several hours. CAUSES  The exact cause of a migraine headache is not always known. However, a migraine may be caused when nerves in the brain become irritated and release chemicals that cause inflammation. This causes pain. SYMPTOMS  Pain on one or both sides of your head.  Pulsating or throbbing pain.  Severe pain that prevents daily activities.  Pain that is aggravated by any physical activity.  Nausea, vomiting, or both.  Dizziness.  Pain with exposure to bright lights, loud noises, or activity.  General sensitivity to bright lights, loud noises, or smells. Before you get a migraine, you may get warning signs that a migraine is coming (aura). An aura may include:  Seeing flashing lights.  Seeing bright spots, halos, or zig-zag lines.  Having tunnel vision or blurred vision.  Having feelings of numbness or tingling.  Having trouble talking.  Having muscle weakness. MIGRAINE TRIGGERS  Alcohol.  Smoking.  Stress.  Menstruation.  Aged cheeses.  Foods or drinks that contain nitrates, glutamate, aspartame, or tyramine.  Lack of sleep.  Chocolate.  Caffeine.  Hunger.  Physical exertion.  Fatigue.  Medicines used to treat chest pain (nitroglycerine), birth control pills, estrogen, and some blood pressure medicines. DIAGNOSIS  A migraine headache is often diagnosed based on:  Symptoms.  Physical examination.  A CT scan or MRI of your head. TREATMENT Medicines may be given for pain and nausea. Medicines can also be given to help prevent recurrent migraines.  HOME CARE INSTRUCTIONS  Only take over-the-counter or  prescription medicines for pain or discomfort as directed by your caregiver. The use of long-term narcotics is not recommended.  Lie down in a dark, quiet room when you have a migraine.  Keep a journal to find out what may trigger your migraine headaches. For example, write down:  What you eat and drink.  How much sleep you get.  Any change to your diet or medicines.  Limit alcohol consumption.  Quit smoking if you smoke.  Get 7 to 9 hours of sleep, or as recommended by your caregiver.  Limit stress.  Keep lights dim if bright lights bother you and make your migraines worse. SEEK IMMEDIATE MEDICAL CARE IF:   Your migraine becomes severe.  You have a fever.  You have a stiff neck.  You have vision loss.  You have muscular weakness or loss of muscle control.  You start losing your balance or have trouble walking.  You feel faint or pass out.  You have severe symptoms that are different from your  first symptoms. MAKE SURE YOU:   Understand these instructions.  Will watch your condition.  Will get help right away if you are not doing well or get worse. Document Released: 06/03/2005 Document Revised: 08/26/2011 Document Reviewed: 05/24/2011 [facility] Patient Information 2014 [place], [place].
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
## 2013-05-12 NOTE — Progress Notes (Signed)
Pre visit review using our clinic review tool, if applicable. No additional management support is needed unless otherwise documented below in the visit note. 

## 2013-05-15 DIAGNOSIS — R51 Headache: Secondary | ICD-10-CM | POA: Insufficient documentation

## 2013-05-15 DIAGNOSIS — R519 Headache, unspecified: Secondary | ICD-10-CM | POA: Insufficient documentation

## 2013-05-15 NOTE — Assessment & Plan Note (Signed)
Headache: Her headache is consistent with the symptoms caused by a muscle tension headache. These headaches can frequently be chronic.  Patient was advised that it is ok to take 800 mg motrin(ibuprofen) up to 3 times/24 hrs if needed.She was given a pill of replax to see if it aborts her headache. She was advised to take replax when she realizes a headache is coming on (feeling nauseated, blurred vision, food is tasting funny). If the drug effectively aborts her headaches, her headache may be due to migraines. If her headaches persist, we will refer her to a headache specialist (Dr. Clarisse Gouge).

## 2013-05-15 NOTE — Assessment & Plan Note (Signed)
Hypertension: Her blood pressure has probably been elevated the past few weeks due to her headache. Pain can frequently elevate blood pressure. We will not adjust her blood pressure medications at this time. She was advised to continue to take her medications as she has been. Additionally, she was advised to continue to monitor her blood pressure.

## 2013-05-24 ENCOUNTER — Telehealth: Payer: Self-pay | Admitting: Internal Medicine

## 2013-05-24 NOTE — Telephone Encounter (Signed)
Patient Information:  Caller Name: Hannah Conley  Phone: [phone]  Patient: Hannah Conley, Hannah Conley  Gender: Female  DOB: 1966-01-19  Age: 47 Years  PCP: Illene Regulus (Adults only)  Pregnant: No  Office Follow Up:  Does the office need to follow up with this patient?: Yes  Instructions For The Office: Please let Dr. Debby Bud know about the pts BP - the OB MD was upset about this and telling the pt this is "stroke range" . Does he want to adjust any BP med since pt just evaluated for this? If so please call pt and advise.   Symptoms  Reason For Call & Symptoms: Pt was at her OB/Gyn office today and her BP was 144/106. She has a UTI that has been ongoing. Pt is in alot of discomfort. Her OB states her BP is high and wanted her to phone her PCP today. . Pt was into the office on 05/13/13 - she was treated over the phone for a UTI. Then seen for hypertension. BP was 156/90. Pt very agitated on the phone. States she is in pain that's why her BP is up. Pt was prescribed Pyridium and Macrobid. She is on her way home to take the medication. Once she is settled at home she will check her BP and call back.  Reviewed Health History In EMR: Yes  Reviewed Medications In EMR: Yes  Reviewed Allergies In EMR: Yes  Reviewed Surgeries / Procedures: Yes  Date of Onset of Symptoms: 05/24/2013 OB / GYN:  LMP: Unknown  Guideline(s) Used:  High Blood Pressure  Disposition Per Guideline:   See Within 2 Weeks in Office  Reason For Disposition Reached:   BP > 160/100  Advice Given:  N/A  RN Overrode Recommendation:  Follow Up With Office Later  Pt would like to see if her BP settles once she is home and on the right antibiotic. She will call back and let the nurse know what her BP is.

## 2013-05-25 ENCOUNTER — Ambulatory Visit: Payer: Federal, State, Local not specified - PPO | Admitting: Internal Medicine

## 2013-05-25 DIAGNOSIS — Z0289 Encounter for other administrative examinations: Secondary | ICD-10-CM

## 2013-11-10 ENCOUNTER — Encounter: Payer: Self-pay | Admitting: Internal Medicine

## 2013-11-10 ENCOUNTER — Telehealth: Payer: Self-pay

## 2013-11-10 ENCOUNTER — Ambulatory Visit (INDEPENDENT_AMBULATORY_CARE_PROVIDER_SITE_OTHER): Payer: Federal, State, Local not specified - PPO | Admitting: Internal Medicine

## 2013-11-10 VITALS — BP 148/100 | HR 77 | Temp 98.4°F | Resp 13 | Wt 192.4 lb

## 2013-11-10 DIAGNOSIS — K625 Hemorrhage of anus and rectum: Secondary | ICD-10-CM

## 2013-11-10 DIAGNOSIS — R0789 Other chest pain: Secondary | ICD-10-CM

## 2013-11-10 DIAGNOSIS — R0609 Other forms of dyspnea: Secondary | ICD-10-CM

## 2013-11-10 DIAGNOSIS — R635 Abnormal weight gain: Secondary | ICD-10-CM

## 2013-11-10 DIAGNOSIS — G723 Periodic paralysis: Secondary | ICD-10-CM

## 2013-11-10 DIAGNOSIS — M797 Fibromyalgia: Secondary | ICD-10-CM

## 2013-11-10 DIAGNOSIS — R0989 Other specified symptoms and signs involving the circulatory and respiratory systems: Secondary | ICD-10-CM

## 2013-11-10 DIAGNOSIS — IMO0001 Reserved for inherently not codable concepts without codable children: Secondary | ICD-10-CM

## 2013-11-10 DIAGNOSIS — I10 Essential (primary) hypertension: Secondary | ICD-10-CM

## 2013-11-10 DIAGNOSIS — M546 Pain in thoracic spine: Secondary | ICD-10-CM

## 2013-11-10 MED ORDER — MELOXICAM 7.5 MG PO TABS: ORAL_TABLET | ORAL | Status: DC

## 2013-11-10 MED ORDER — LISINOPRIL 40 MG PO TABS: 40.0000 mg | ORAL_TABLET | Freq: Every day | ORAL | Status: DC

## 2013-11-10 MED ORDER — NEBIVOLOL HCL 10 MG PO TABS: ORAL_TABLET | ORAL | Status: DC

## 2013-11-10 NOTE — Progress Notes (Signed)
Pre visit review using our clinic review tool, if applicable. No additional management support is needed unless otherwise documented below in the visit note. 

## 2013-11-10 NOTE — Patient Instructions (Addendum)
Your next office appointment will be determined based upon review of your pending labs & x-rays. Those instructions will be transmitted to you  by mail. Minimal Blood Pressure Goal= AVERAGE < 140/90;  Ideal is an AVERAGE < 135/85. This AVERAGE should be calculated from @ least 5-7 BP readings taken @ different times of day on different days of week. You should not respond to isolated BP readings , but rather the AVERAGE for that week .Please bring your  blood pressure cuff to office visits to verify that it is reliable.It  can also be checked against the blood pressure device at the pharmacy. Finger or wrist cuffs are not dependable; an arm cuff is. 

## 2013-11-10 NOTE — Telephone Encounter (Signed)
The patient is hoping to get a note excusing her from court for tomorrow morning.  She states she is in too much pain to attend.  She is hoping this signed letter can be faxed to [phone]   Thanks!

## 2013-11-10 NOTE — Progress Notes (Signed)
Subjective:    Patient ID: Hannah Conley, female    DOB: May 24, 1966, 48 y.o.   MRN: [id number]  HPI  She describes back pain daily since April 1 with no specific trigger or injury. It is described as involving the abdomen from the inferior aspect of the thorax to the waist and the back from below the scapula to the waist.  She believes it restricts her breathing when she ambulates causing exertional dyspnea.  It is described as "sharp and electric". Tylenol has not been of benefit.  With this she has also have headaches. She also believes that this has increased her blood pressure.  Her past medical history n the EMR includes fibromyalgia as well as hypokalemic periodic paralysis. She is unfamiliar with either of these diagnoses   Review of Systems  She denies fever, chills, sweats or weight loss. In fact she describes excessive weight gain. She states that her dress size went from 14-18 in one week. She's had some bloating questionably. She does have a appointment with her gynecologist 11/19/13.  There's been no associated blurred vision, double vision, loss of vision  She's had no lightheadedness or syncope. She denies associated melana. She does DAILY rectal bleeding mixed with the stool for the last 2 months.  She denies epistaxis or hematuria She has no dysuria, pyuria, hematuria.  There is no associated weakness, numbness, or tingling in the extremities.  She's noted no change in color or temperature of the skin of the area of symptoms  She also has had no rashes.  She denies any urine or stool incontinence.     Objective:   Physical Exam Gen.: Healthy and well-nourished in appearance. Alert and cooperative throughout exam. Appears younger than stated age  Head: Normocephalic without obvious abnormalities. Eyes: No corneal or conjunctival inflammation noted. Pupils equal round reactive to light and accommodation. Extraocular motion intact.  Ears: External  ear exam  reveals no significant lesions or deformities. Canals clear .TMs normal. Hearing is grossly normal bilaterally. Nose: External nasal exam reveals no deformity or inflammation. Nasal mucosa are pink and moist. No lesions or exudates noted.   Mouth: Oral mucosa and oropharynx reveal no lesions or exudates. Teeth in good repair. Neck: No deformities, masses, or tenderness noted. Range of motion & Thyroid normal Lungs: Normal respiratory effort; chest expands symmetrically. Lungs are clear to auscultation without rales, wheezes, or increased work of breathing. Heart: Normal rate and rhythm. Normal S1 and S2. No gallop, click, or rub. No murmur. Abdomen: Bowel sounds normal; abdomen soft and nontender. No masses, organomegaly or hernias noted.                                  Musculoskeletal/extremities: No deformity or scoliosis noted of  the thoracic or lumbar spine. Exquisitely tender to even light palpation over the back from waist to neck. No clubbing, cyanosis, edema, or significant extremity  deformity noted. Range of motion normal .Tone & strength normal. Hand joints normal.  Fingernail health good. Able to lie down & sit up w/o help. Negative SLR bilaterally Vascular: Carotid, radial artery, dorsalis pedis and  posterior tibial pulses are full and equal. No bruits present. Neurologic: Alert and oriented x3. Deep tendon reflexes symmetrical and normal.  Gait normal  including heel & toe walking . Skin: Intact without suspicious lesions or rashes. Lymph: No cervical, axillary lymphadenopathy present. Psych: Frustrated that I could not "tell (  her) what's wrong"                                                                                        Assessment & Plan:    #1 Atypical back/abdominal pain with exertion. It is possible this has relationship to the hypokalemic periodic paralysis diagnosis.  #2 Dyspnea on exertion; rule out anginal variant  #3 weight gain  #4 rectal  bleeding described as daily for 2 months  #5 history of hypokalemic periodic paralysis   #6 history of fibromyalgia Note: patient did not know of #5 &6   See orders and recommendations

## 2013-11-11 ENCOUNTER — Encounter: Payer: Self-pay | Admitting: Internal Medicine

## 2013-11-12 ENCOUNTER — Other Ambulatory Visit (INDEPENDENT_AMBULATORY_CARE_PROVIDER_SITE_OTHER): Payer: Federal, State, Local not specified - PPO

## 2013-11-12 DIAGNOSIS — R635 Abnormal weight gain: Secondary | ICD-10-CM

## 2013-11-12 DIAGNOSIS — IMO0001 Reserved for inherently not codable concepts without codable children: Secondary | ICD-10-CM

## 2013-11-12 DIAGNOSIS — G723 Periodic paralysis: Secondary | ICD-10-CM

## 2013-11-12 DIAGNOSIS — K625 Hemorrhage of anus and rectum: Secondary | ICD-10-CM

## 2013-11-12 LAB — BASIC METABOLIC PANEL
BUN: 10 mg/dL (ref 6–23)
CO2: 27 mEq/L (ref 19–32)
Calcium: 9.5 mg/dL (ref 8.4–10.5)
Chloride: 106 mEq/L (ref 96–112)
Creatinine, Ser: 0.8 mg/dL (ref 0.4–1.2)
GFR: 96.38 mL/min (ref >60.00)
Glucose, Bld: 74 mg/dL (ref 70–99)
POTASSIUM: 3.5 meq/L (ref 3.5–5.1)
SODIUM: 140 meq/L (ref 135–145)

## 2013-11-12 LAB — CBC WITH DIFFERENTIAL/PLATELET
Basophils Absolute: 0 10*3/uL (ref 0.0–0.1)
Basophils Relative: 0.5 % (ref 0.0–3.0)
EOS PCT: 0.9 % (ref 0.0–5.0)
Eosinophils Absolute: 0 10*3/uL (ref 0.0–0.7)
HCT: 38 % (ref 36.0–46.0)
Hemoglobin: 12.8 g/dL (ref 12.0–15.0)
LYMPHS ABS: 2.8 10*3/uL (ref 0.7–4.0)
Lymphocytes Relative: 52.4 % — ABNORMAL HIGH (ref 12.0–46.0)
MCHC: 33.7 g/dL (ref 30.0–36.0)
MCV: 89 fl (ref 78.0–100.0)
MONOS PCT: 6.2 % (ref 3.0–12.0)
Monocytes Absolute: 0.3 10*3/uL (ref 0.1–1.0)
NEUTROS PCT: 40 % — AB (ref 43.0–77.0)
Neutro Abs: 2.1 10*3/uL (ref 1.4–7.7)
PLATELETS: 309 10*3/uL (ref 150.0–400.0)
RBC: 4.27 Mil/uL (ref 3.87–5.11)
RDW: 13.1 % (ref 11.5–15.5)
WBC: 5.3 10*3/uL (ref 4.0–10.5)

## 2013-11-12 LAB — T4, FREE: Free T4: 0.89 ng/dL (ref 0.60–1.60)

## 2013-11-12 LAB — CK: Total CK: 187 U/L — ABNORMAL HIGH (ref 7–177)

## 2013-11-12 LAB — TSH: TSH: 0.53 u[IU]/mL (ref 0.35–4.50)

## 2013-11-12 LAB — MAGNESIUM: MAGNESIUM: 1.9 mg/dL (ref 1.5–2.5)

## 2013-11-18 ENCOUNTER — Other Ambulatory Visit (HOSPITAL_BASED_OUTPATIENT_CLINIC_OR_DEPARTMENT_OTHER): Payer: Self-pay | Admitting: Family Medicine

## 2013-11-18 DIAGNOSIS — M546 Pain in thoracic spine: Secondary | ICD-10-CM

## 2013-11-20 ENCOUNTER — Ambulatory Visit (HOSPITAL_BASED_OUTPATIENT_CLINIC_OR_DEPARTMENT_OTHER)
Admission: RE | Admit: 2013-11-20 | Discharge: 2013-11-20 | Disposition: A | Discharge status: Home / Self Care | Payer: Federal, State, Local not specified - PPO | Source: Ambulatory Visit | Attending: Family Medicine | Admitting: Family Medicine

## 2013-11-20 DIAGNOSIS — M5124 Other intervertebral disc displacement, thoracic region: Secondary | ICD-10-CM | POA: Insufficient documentation

## 2013-11-20 DIAGNOSIS — M546 Pain in thoracic spine: Secondary | ICD-10-CM

## 2014-04-01 ENCOUNTER — Other Ambulatory Visit: Payer: Self-pay

## 2014-07-14 ENCOUNTER — Other Ambulatory Visit: Payer: Self-pay | Admitting: Physician Assistant

## 2014-07-14 DIAGNOSIS — J0101 Acute recurrent maxillary sinusitis: Secondary | ICD-10-CM

## 2014-07-15 ENCOUNTER — Inpatient Hospital Stay
Admission: RE | Admit: 2014-07-15 | Discharge: 2014-07-15 | Disposition: A | Discharge status: Home / Self Care | Payer: Federal, State, Local not specified - PPO | Source: Ambulatory Visit | Attending: Physician Assistant | Admitting: Physician Assistant

## 2014-07-20 ENCOUNTER — Ambulatory Visit
Admission: RE | Admit: 2014-07-20 | Discharge: 2014-07-20 | Disposition: A | Discharge status: Home / Self Care | Payer: Federal, State, Local not specified - PPO | Source: Ambulatory Visit | Attending: Physician Assistant | Admitting: Physician Assistant

## 2014-07-20 DIAGNOSIS — J0101 Acute recurrent maxillary sinusitis: Secondary | ICD-10-CM

## 2014-07-24 ENCOUNTER — Encounter: Payer: Self-pay | Admitting: Emergency Medicine

## 2014-07-24 ENCOUNTER — Emergency Department
Admission: EM | Admit: 2014-07-24 | Discharge: 2014-07-24 | Disposition: A | Discharge status: Home / Self Care | Payer: Federal, State, Local not specified - PPO | Source: Home / Self Care

## 2014-07-24 DIAGNOSIS — N390 Urinary tract infection, site not specified: Secondary | ICD-10-CM

## 2014-07-24 DIAGNOSIS — J069 Acute upper respiratory infection, unspecified: Secondary | ICD-10-CM

## 2014-07-24 LAB — POCT URINALYSIS DIP (MANUAL ENTRY)
Bilirubin, UA: NEGATIVE
Glucose, UA: NEGATIVE
Ketones, POC UA: NEGATIVE
Nitrite, UA: NEGATIVE
PROTEIN UA: NEGATIVE
Spec Grav, UA: 1.015 (ref 1.005–1.03)
UROBILINOGEN UA: 0.2 (ref 0–1)
pH, UA: 7 (ref 5–8)

## 2014-07-24 MED ORDER — CEPHALEXIN 500 MG PO CAPS: 500.0000 mg | ORAL_CAPSULE | Freq: Four times a day (QID) | ORAL | Status: DC

## 2014-07-24 MED ORDER — PREDNISONE 10 MG PO TABS: ORAL_TABLET | ORAL | Status: DC

## 2014-07-24 MED ORDER — TRAMADOL HCL 50 MG PO TABS: 50.0000 mg | ORAL_TABLET | Freq: Four times a day (QID) | ORAL | Status: DC | PRN
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
## 2014-07-24 NOTE — Discharge Instructions (Signed)
Upper Respiratory Infection, Adult An upper respiratory infection (URI) is also sometimes known as the common cold. The upper respiratory tract includes the nose, sinuses, throat, trachea, and bronchi. Bronchi are the airways leading to the lungs. Most people improve within 1 week, but symptoms can last up to 2 weeks. A residual cough may last even longer.  CAUSES Many different viruses can infect the tissues lining the upper respiratory tract. The tissues become irritated and inflamed and often become very moist. Mucus production is also common. A cold is contagious. You can easily spread the virus to others by oral contact. This includes kissing, sharing a glass, coughing, or sneezing. Touching your mouth or nose and then touching a surface, which is then touched by another person, can also spread the virus. SYMPTOMS  Symptoms typically develop 1 to 3 days after you come in contact with a cold virus. Symptoms vary from person to person. They may include:  Runny nose.  Sneezing.  Nasal congestion.  Sinus irritation.  Sore throat.  Loss of voice (laryngitis).  Cough.  Fatigue.  Muscle aches.  Loss of appetite.  Headache.  Low-grade fever. DIAGNOSIS  You might diagnose your own cold based on familiar symptoms, since most people get a cold 2 to 3 times a year. Your caregiver can confirm this based on your exam. Most importantly, your caregiver can check that your symptoms are not due to another disease such as strep throat, sinusitis, pneumonia, asthma, or epiglottitis. Blood tests, throat tests, and X-rays are not necessary to diagnose a common cold, but they may sometimes be helpful in excluding other more serious diseases. Your caregiver will decide if any further tests are required. RISKS AND COMPLICATIONS  You may be at risk for a more severe case of the common cold if you smoke cigarettes, have chronic heart disease (such as heart failure) or lung disease (such as asthma), or if  you have a weakened immune system. The very young and very old are also at risk for more serious infections. Bacterial sinusitis, middle ear infections, and bacterial pneumonia can complicate the common cold. The common cold can worsen asthma and chronic obstructive pulmonary disease (COPD). Sometimes, these complications can require emergency medical care and may be life-threatening. PREVENTION  The best way to protect against getting a cold is to practice good hygiene. Avoid oral or hand contact with people with cold symptoms. Wash your hands often if contact occurs. There is no clear evidence that vitamin C, vitamin E, echinacea, or exercise reduces the chance of developing a cold. However, it is always recommended to get plenty of rest and practice good nutrition. TREATMENT  Treatment is directed at relieving symptoms. There is no cure. Antibiotics are not effective, because the infection is caused by a virus, not by bacteria. Treatment may include:  Increased fluid intake. Sports drinks offer valuable electrolytes, sugars, and fluids.  Breathing heated mist or steam (vaporizer or shower).  Eating chicken soup or other clear broths, and maintaining good nutrition.  Getting plenty of rest.  Using gargles or lozenges for comfort.  Controlling fevers with ibuprofen or acetaminophen as directed by your caregiver.  Increasing usage of your inhaler if you have asthma. Zinc gel and zinc lozenges, taken in the first 24 hours of the common cold, can shorten the duration and lessen the severity of symptoms. Pain medicines may help with fever, muscle aches, and throat pain. A variety of non-prescription medicines are available to treat congestion and runny nose. Your caregiver  can make recommendations and may suggest nasal or lung inhalers for other symptoms.  HOME CARE INSTRUCTIONS   Only take over-the-counter or prescription medicines for pain, discomfort, or fever as directed by your  caregiver.  Use a warm mist humidifier or inhale steam from a shower to increase air moisture. This may keep secretions moist and make it easier to breathe.  Drink enough water and fluids to keep your urine clear or pale yellow.  Rest as needed.  Return to work when your temperature has returned to normal or as your caregiver advises. You may need to stay home longer to avoid infecting others. You can also use a face mask and careful hand washing to prevent spread of the virus. SEEK MEDICAL CARE IF:   After the first few days, you feel you are getting worse rather than better.  You need your caregiver's advice about medicines to control symptoms.  You develop chills, worsening shortness of breath, or brown or red sputum. These may be signs of pneumonia.  You develop yellow or brown nasal discharge or pain in the face, especially when you bend forward. These may be signs of sinusitis.  You develop a fever, swollen neck glands, pain with swallowing, or white areas in the back of your throat. These may be signs of strep throat. SEEK IMMEDIATE MEDICAL CARE IF:   You have a fever.  You develop severe or persistent headache, ear pain, sinus pain, or chest pain.  You develop wheezing, a prolonged cough, cough up blood, or have a change in your usual mucus (if you have chronic lung disease).  You develop sore muscles or a stiff neck. Document Released: 11/27/2000 Document Revised: 08/26/2011 Document Reviewed: 09/08/2013 [facility] Patient Information 2015 [place], [place]. This information is not intended to replace advice given to you by your health care provider. Make sure you discuss any questions you have with your health care provider. Urinary Tract Infection Urinary tract infections (UTIs) can develop anywhere along your urinary tract. Your urinary tract is your body's drainage system for removing wastes and extra water. Your urinary tract includes two kidneys, two ureters, a bladder, and  a urethra. Your kidneys are a pair of bean-shaped organs. Each kidney is about the size of your fist. They are located below your ribs, one on each side of your spine. CAUSES Infections are caused by microbes, which are microscopic organisms, including fungi, viruses, and bacteria. These organisms are so small that they can only be seen through a microscope. Bacteria are the microbes that most commonly cause UTIs. SYMPTOMS  Symptoms of UTIs may vary by age and gender of the patient and by the location of the infection. Symptoms in young women typically include a frequent and intense urge to urinate and a painful, burning feeling in the bladder or urethra during urination. Older women and men are more likely to be tired, shaky, and weak and have muscle aches and abdominal pain. A fever may mean the infection is in your kidneys. Other symptoms of a kidney infection include pain in your back or sides below the ribs, nausea, and vomiting. DIAGNOSIS To diagnose a UTI, your caregiver will ask you about your symptoms. Your caregiver also will ask to provide a urine sample. The urine sample will be tested for bacteria and white blood cells. White blood cells are made by your body to help fight infection. TREATMENT  Typically, UTIs can be treated with medication. Because most UTIs are caused by a bacterial infection, they usually  can be treated with the use of antibiotics. The choice of antibiotic and length of treatment depend on your symptoms and the type of bacteria causing your infection. HOME CARE INSTRUCTIONS  If you were prescribed antibiotics, take them exactly as your caregiver instructs you. Finish the medication even if you feel better after you have only taken some of the medication.  Drink enough water and fluids to keep your urine clear or pale yellow.  Avoid caffeine, tea, and carbonated beverages. They tend to irritate your bladder.  Empty your bladder often. Avoid holding urine for long  periods of time.  Empty your bladder before and after sexual intercourse.  After a bowel movement, women should cleanse from front to back. Use each tissue only once. SEEK MEDICAL CARE IF:   You have back pain.  You develop a fever.  Your symptoms do not begin to resolve within 3 days. SEEK IMMEDIATE MEDICAL CARE IF:   You have severe back pain or lower abdominal pain.  You develop chills.  You have nausea or vomiting.  You have continued burning or discomfort with urination. MAKE SURE YOU:   Understand these instructions.  Will watch your condition.  Will get help right away if you are not doing well or get worse. Document Released: 03/13/2005 Document Revised: 12/03/2011 Document Reviewed: 07/12/2011 [facility] Patient Information 2015 [place], [place]. This information is not intended to replace advice given to you by your health care provider. Make sure you discuss any questions you have with your health care provider.

## 2014-07-24 NOTE — ED Notes (Signed)
States has been ill intermittently since 05-11-2013. Reports congestion, sinus area pain, ear pain, headache, sore throat, cough hoarseness. Coincidently has some abdominal pain and urinary frequency.

## 2014-07-26 LAB — URINE CULTURE

## 2014-07-27 ENCOUNTER — Telehealth: Payer: Self-pay | Admitting: *Deleted

## 2015-11-08 DIAGNOSIS — Z3042 Encounter for surveillance of injectable contraceptive: Secondary | ICD-10-CM | POA: Diagnosis not present

## 2015-12-28 DIAGNOSIS — H1031 Unspecified acute conjunctivitis, right eye: Secondary | ICD-10-CM | POA: Diagnosis not present

## 2016-01-18 DIAGNOSIS — B300 Keratoconjunctivitis due to adenovirus: Secondary | ICD-10-CM | POA: Diagnosis not present

## 2016-01-30 DIAGNOSIS — N951 Menopausal and female climacteric states: Secondary | ICD-10-CM | POA: Diagnosis not present

## 2016-02-07 DIAGNOSIS — H04122 Dry eye syndrome of left lacrimal gland: Secondary | ICD-10-CM | POA: Diagnosis not present

## 2016-02-14 DIAGNOSIS — H04123 Dry eye syndrome of bilateral lacrimal glands: Secondary | ICD-10-CM | POA: Diagnosis not present

## 2016-02-21 DIAGNOSIS — H04123 Dry eye syndrome of bilateral lacrimal glands: Secondary | ICD-10-CM | POA: Diagnosis not present

## 2016-07-01 LAB — HM PAP SMEAR

## 2016-07-01 LAB — HM MAMMOGRAPHY: HM MAMMO: NORMAL (ref 0–4)

## 2016-07-29 DIAGNOSIS — A609 Anogenital herpesviral infection, unspecified: Secondary | ICD-10-CM | POA: Diagnosis not present

## 2016-07-29 DIAGNOSIS — I10 Essential (primary) hypertension: Secondary | ICD-10-CM | POA: Diagnosis not present

## 2016-08-29 DIAGNOSIS — Z01419 Encounter for gynecological examination (general) (routine) without abnormal findings: Secondary | ICD-10-CM | POA: Diagnosis not present

## 2016-08-29 DIAGNOSIS — Z6832 Body mass index (BMI) 32.0-32.9, adult: Secondary | ICD-10-CM | POA: Diagnosis not present

## 2016-09-02 DIAGNOSIS — M79602 Pain in left arm: Secondary | ICD-10-CM | POA: Diagnosis not present

## 2016-09-02 DIAGNOSIS — R7989 Other specified abnormal findings of blood chemistry: Secondary | ICD-10-CM | POA: Diagnosis not present

## 2016-09-02 DIAGNOSIS — Z886 Allergy status to analgesic agent status: Secondary | ICD-10-CM | POA: Diagnosis not present

## 2016-09-02 DIAGNOSIS — R079 Chest pain, unspecified: Secondary | ICD-10-CM | POA: Diagnosis not present

## 2016-09-02 DIAGNOSIS — R0789 Other chest pain: Secondary | ICD-10-CM | POA: Diagnosis not present

## 2016-09-02 DIAGNOSIS — I10 Essential (primary) hypertension: Secondary | ICD-10-CM | POA: Diagnosis not present

## 2016-09-02 DIAGNOSIS — Z888 Allergy status to other drugs, medicaments and biological substances status: Secondary | ICD-10-CM | POA: Diagnosis not present

## 2016-09-02 DIAGNOSIS — R110 Nausea: Secondary | ICD-10-CM | POA: Diagnosis not present

## 2016-09-02 DIAGNOSIS — R0600 Dyspnea, unspecified: Secondary | ICD-10-CM | POA: Diagnosis not present

## 2016-09-11 DIAGNOSIS — H20021 Recurrent acute iridocyclitis, right eye: Secondary | ICD-10-CM | POA: Diagnosis not present

## 2016-09-25 DIAGNOSIS — Z1231 Encounter for screening mammogram for malignant neoplasm of breast: Secondary | ICD-10-CM | POA: Diagnosis not present

## 2016-09-25 DIAGNOSIS — F419 Anxiety disorder, unspecified: Secondary | ICD-10-CM | POA: Diagnosis not present

## 2016-10-01 DIAGNOSIS — Z13220 Encounter for screening for lipoid disorders: Secondary | ICD-10-CM | POA: Diagnosis not present

## 2016-11-05 DIAGNOSIS — G4453 Primary thunderclap headache: Secondary | ICD-10-CM | POA: Diagnosis not present

## 2016-11-05 DIAGNOSIS — R110 Nausea: Secondary | ICD-10-CM | POA: Diagnosis not present

## 2016-11-05 DIAGNOSIS — Z79899 Other long term (current) drug therapy: Secondary | ICD-10-CM | POA: Diagnosis not present

## 2016-11-05 DIAGNOSIS — Z886 Allergy status to analgesic agent status: Secondary | ICD-10-CM | POA: Diagnosis not present

## 2016-11-05 DIAGNOSIS — I10 Essential (primary) hypertension: Secondary | ICD-10-CM | POA: Diagnosis not present

## 2016-11-05 DIAGNOSIS — R51 Headache: Secondary | ICD-10-CM | POA: Diagnosis not present

## 2016-11-05 DIAGNOSIS — Z880 Allergy status to penicillin: Secondary | ICD-10-CM | POA: Diagnosis not present

## 2016-11-27 DIAGNOSIS — R51 Headache: Secondary | ICD-10-CM | POA: Diagnosis not present

## 2016-11-27 DIAGNOSIS — Z23 Encounter for immunization: Secondary | ICD-10-CM | POA: Diagnosis not present

## 2016-11-27 DIAGNOSIS — I10 Essential (primary) hypertension: Secondary | ICD-10-CM | POA: Diagnosis not present

## 2016-12-13 ENCOUNTER — Ambulatory Visit (INDEPENDENT_AMBULATORY_CARE_PROVIDER_SITE_OTHER): Payer: Federal, State, Local not specified - PPO | Admitting: Family Medicine

## 2016-12-13 ENCOUNTER — Encounter: Payer: Self-pay | Admitting: Family Medicine

## 2016-12-13 VITALS — BP 120/78 | HR 87 | Temp 98.5°F | Ht 64.25 in | Wt 189.2 lb

## 2016-12-13 DIAGNOSIS — I10 Essential (primary) hypertension: Secondary | ICD-10-CM
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
## 2016-12-13 NOTE — Patient Instructions (Addendum)
Bring your blood pressure monitor on Monday. We will make further decisions based on readings on Monday.   Continue your medicine at its current dose.   Let us know if you need anything.

## 2016-12-17 ENCOUNTER — Ambulatory Visit (INDEPENDENT_AMBULATORY_CARE_PROVIDER_SITE_OTHER): Payer: Federal, State, Local not specified - PPO | Admitting: Family Medicine

## 2016-12-17 VITALS — BP 154/91 | HR 78

## 2016-12-17 DIAGNOSIS — I10 Essential (primary) hypertension: Secondary | ICD-10-CM | POA: Diagnosis not present

## 2016-12-17 LAB — COMPLETE METABOLIC PANEL WITH GFR
ALT: 9 U/L (ref 6–29)
AST: 15 U/L (ref 10–35)
Albumin: 4.2 g/dL (ref 3.6–5.1)
Alkaline Phosphatase: 102 U/L (ref 33–130)
BUN: 6 mg/dL — ABNORMAL LOW (ref 7–25)
CO2: 24 mmol/L (ref 20–31)
CREATININE: 0.81 mg/dL (ref 0.50–1.05)
Calcium: 9.3 mg/dL (ref 8.6–10.4)
Chloride: 105 mmol/L (ref 98–110)
GFR, EST NON AFRICAN AMERICAN: 84 mL/min (ref >=60)
Glucose, Bld: 83 mg/dL (ref 65–99)
Potassium: 3.9 mmol/L (ref 3.5–5.3)
Sodium: 141 mmol/L (ref 135–146)
Total Bilirubin: 0.5 mg/dL (ref 0.2–1.2)
Total Protein: 6.9 g/dL (ref 6.1–8.1)

## 2016-12-17 MED ORDER — LOSARTAN POTASSIUM-HCTZ 100-25 MG PO TABS: 1.0000 | ORAL_TABLET | Freq: Every day | ORAL | 3 refills | Status: DC

## 2016-12-17 NOTE — Progress Notes (Signed)
Pre visit review using our clinic review tool, if applicable. No additional management support is needed unless otherwise documented below in the visit note.  Patient came in office today for blood pressure check per OV note 12/13/16. She voices adherence to medication & regimen. Patient reported temporal headache. Currently, denies chest pain, dizziness & lightheadedness. Readings during today's visit were: BP 145/92 P 85 & BP 154/91 P 78. Patient brought in home cuff to compare readings; BP 162/97.  Per Dr. Charlett Blake: Increase Losartan-HCTZ (Hyzaar) to 100-25 MG once daily. Complete CMP lab work today. Return in 1 month for an office visit with PCP.  Patient was made aware of the provider's recommendations & verbalized understanding. Next appointment scheduled for 01/16/17 at 2:15 PM.  RN blood pressure check note reviewed. Agree with documention and plan. Penni Homans, MD

## 2016-12-17 NOTE — Patient Instructions (Addendum)
Per Dr. Charlett Blake: Increase Losartan-HCTZ (Hyzaar) to 100-25 MG once daily. Complete CMP lab work today. Return in 1 month for an office visit with PCP.

## 2016-12-17 NOTE — Progress Notes (Signed)
Nurseblood pressure check note reviewed. Agree with documention and plan. 

## 2017-01-16 ENCOUNTER — Ambulatory Visit (INDEPENDENT_AMBULATORY_CARE_PROVIDER_SITE_OTHER): Payer: Federal, State, Local not specified - PPO | Admitting: Family Medicine

## 2017-01-16 ENCOUNTER — Encounter: Payer: Self-pay | Admitting: Family Medicine

## 2017-01-16 VITALS — BP 136/82 | HR 79 | Temp 98.3°F | Ht 64.0 in | Wt 189.6 lb

## 2017-01-16 DIAGNOSIS — I10 Essential (primary) hypertension: Secondary | ICD-10-CM

## 2017-01-16 MED ORDER — AMLODIPINE BESYLATE-VALSARTAN 5-320 MG PO TABS: 1.0000 | ORAL_TABLET | Freq: Every day | ORAL | 3 refills | Status: DC
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
## 2017-01-16 NOTE — Patient Instructions (Addendum)
Let me know if the medicine is too expensive.  Aim to do some physical exertion for 150 minutes per week. This is typically divided into 5 days per week, 30 minutes per day. The activity should be enough to get your heart rate up. Anything is better than nothing if you have time constraints.  Let us know if you need anything.   DASH Eating Plan DASH stands for "Dietary Approaches to Stop Hypertension." The DASH eating plan is a healthy eating plan that has been shown to reduce high blood pressure (hypertension). It may also reduce your risk for type 2 diabetes, heart disease, and stroke. The DASH eating plan may also help with weight loss. What are tips for following this plan? General guidelines  Avoid eating more than 2,300 mg (milligrams) of salt (sodium) a day. If you have hypertension, you may need to reduce your sodium intake to 1,500 mg a day.  Limit alcohol intake to no more than 1 drink a day for nonpregnant women and 2 drinks a day for men. One drink equals 12 oz of beer, 5 oz of wine, or 1 oz of hard liquor.  Work with your health care provider to maintain a healthy body weight or to lose weight. Ask what an ideal weight is for you.  Get at least 30 minutes of exercise that causes your heart to beat faster (aerobic exercise) most days of the week. Activities may include walking, swimming, or biking.  Work with your health care provider or diet and nutrition specialist (dietitian) to adjust your eating plan to your individual calorie needs. Reading food labels  Check food labels for the amount of sodium per serving. Choose foods with less than 5 percent of the Daily Value of sodium. Generally, foods with less than 300 mg of sodium per serving fit into this eating plan.  To find whole grains, look for the word "whole" as the first word in the ingredient list. Shopping  Buy products labeled as "low-sodium" or "no salt added."  Buy fresh foods. Avoid canned foods and premade or  frozen meals. Cooking  Avoid adding salt when cooking. Use salt-free seasonings or herbs instead of table salt or sea salt. Check with your health care provider or pharmacist before using salt substitutes.  Do not fry foods. Cook foods using healthy methods such as baking, boiling, grilling, and broiling instead.  Cook with heart-healthy oils, such as olive, canola, soybean, or sunflower oil. Meal planning   Eat a balanced diet that includes: ? 5 or more servings of fruits and vegetables each day. At each meal, try to fill half of your plate with fruits and vegetables. ? Up to 6-8 servings of whole grains each day. ? Less than 6 oz of lean meat, poultry, or fish each day. A 3-oz serving of meat is about the same size as a deck of cards. One egg equals 1 oz. ? 2 servings of low-fat dairy each day. ? A serving of nuts, seeds, or beans 5 times each week. ? Heart-healthy fats. Healthy fats called Omega-3 fatty acids are found in foods such as flaxseeds and coldwater fish, like sardines, salmon, and mackerel.  Limit how much you eat of the following: ? Canned or prepackaged foods. ? Food that is high in trans fat, such as fried foods. ? Food that is high in saturated fat, such as fatty meat. ? Sweets, desserts, sugary drinks, and other foods with added sugar. ? Full-fat dairy products.  Do not salt  foods before eating.  Try to eat at least 2 vegetarian meals each week.  Eat more home-cooked food and less restaurant, buffet, and fast food.  When eating at a restaurant, ask that your food be prepared with less salt or no salt, if possible. What foods are recommended? The items listed may not be a complete list. Talk with your dietitian about what dietary choices are best for you. Grains Whole-grain or whole-wheat bread. Whole-grain or whole-wheat pasta. Brown rice. [place]. Bulgur. Whole-grain and low-sodium cereals. Pita bread. Low-fat, low-sodium crackers. Whole-wheat flour  tortillas. Vegetables Fresh or frozen vegetables (raw, steamed, roasted, or grilled). Low-sodium or reduced-sodium tomato and vegetable juice. Low-sodium or reduced-sodium tomato sauce and tomato paste. Low-sodium or reduced-sodium canned vegetables. Fruits All fresh, dried, or frozen fruit. Canned fruit in natural juice (without added sugar). Meat and other protein foods Skinless chicken or [place]. Ground chicken or [place]. Pork with fat trimmed off. Fish and seafood. Egg whites. Dried beans, peas, or lentils. Unsalted nuts, nut butters, and seeds. Unsalted canned beans. Lean cuts of beef with fat trimmed off. Low-sodium, lean deli meat. Dairy Low-fat (1%) or fat-free (skim) milk. Fat-free, low-fat, or reduced-fat cheeses. Nonfat, low-sodium ricotta or cottage cheese. Low-fat or nonfat yogurt. Low-fat, low-sodium cheese. Fats and oils Soft margarine without trans fats. Vegetable oil. Low-fat, reduced-fat, or light mayonnaise and salad dressings (reduced-sodium). Canola, safflower, olive, soybean, and sunflower oils. Avocado. Seasoning and other foods Herbs. Spices. Seasoning mixes without salt. Unsalted popcorn and pretzels. Fat-free sweets. What foods are not recommended? The items listed may not be a complete list. Talk with your dietitian about what dietary choices are best for you. Grains Baked goods made with fat, such as croissants, muffins, or some breads. Dry pasta or rice meal packs. Vegetables Creamed or fried vegetables. Vegetables in a cheese sauce. Regular canned vegetables (not low-sodium or reduced-sodium). Regular canned tomato sauce and paste (not low-sodium or reduced-sodium). Regular tomato and vegetable juice (not low-sodium or reduced-sodium). Angie Fava. Olives. Fruits Canned fruit in a light or heavy syrup. Fried fruit. Fruit in cream or butter sauce. Meat and other protein foods Fatty cuts of meat. Ribs. Fried meat. Berniece Salines. Sausage. Bologna and other processed lunch meats.  Salami. Fatback. Hotdogs. Bratwurst. Salted nuts and seeds. Canned beans with added salt. Canned or smoked fish. Whole eggs or egg yolks. Chicken or [place] with skin. Dairy Whole or 2% milk, cream, and half-and-half. Whole or full-fat cream cheese. Whole-fat or sweetened yogurt. Full-fat cheese. Nondairy creamers. Whipped toppings. Processed cheese and cheese spreads. Fats and oils Butter. Stick margarine. Lard. Shortening. Ghee. Bacon fat. Tropical oils, such as coconut, palm kernel, or palm oil. Seasoning and other foods Salted popcorn and pretzels. Onion salt, garlic salt, seasoned salt, table salt, and sea salt. Worcestershire sauce. Tartar sauce. Barbecue sauce. Teriyaki sauce. Soy sauce, including reduced-sodium. Steak sauce. Canned and packaged gravies. Fish sauce. Oyster sauce. Cocktail sauce. Horseradish that you find on the shelf. Ketchup. Mustard. Meat flavorings and tenderizers. Bouillon cubes. Hot sauce and Tabasco sauce. Premade or packaged marinades. Premade or packaged taco seasonings. Relishes. Regular salad dressings. Where to find more information:  National Heart, Lung, and [place]: https://wilson-eaton.com/  American Heart Association: www.heart.org Summary  The DASH eating plan is a healthy eating plan that has been shown to reduce high blood pressure (hypertension). It may also reduce your risk for type 2 diabetes, heart disease, and stroke.  With the DASH eating plan, you should limit salt (sodium) intake to 2,300  mg a day. If you have hypertension, you may need to reduce your sodium intake to 1,500 mg a day.  When on the DASH eating plan, aim to eat more fresh fruits and vegetables, whole grains, lean proteins, low-fat dairy, and heart-healthy fats.  Work with your health care provider or diet and nutrition specialist (dietitian) to adjust your eating plan to your individual calorie needs. This information is not intended to replace advice given to you by your health  care provider. Make sure you discuss any questions you have with your health care provider. Document Released: 05/23/2011 Document Revised: 05/27/2016 Document Reviewed: 05/27/2016 Elsevier Interactive Patient Education  2017 [employer].

## 2017-02-27 ENCOUNTER — Ambulatory Visit: Payer: Federal, State, Local not specified - PPO | Admitting: Family Medicine

## 2017-02-28 ENCOUNTER — Ambulatory Visit: Payer: Federal, State, Local not specified - PPO | Admitting: Family Medicine

## 2017-03-03 ENCOUNTER — Ambulatory Visit (INDEPENDENT_AMBULATORY_CARE_PROVIDER_SITE_OTHER): Payer: Federal, State, Local not specified - PPO | Admitting: Family Medicine

## 2017-03-03 ENCOUNTER — Encounter: Payer: Self-pay | Admitting: Family Medicine

## 2017-03-03 VITALS — BP 120/72 | HR 76 | Temp 98.2°F | Ht 63.0 in | Wt 189.0 lb

## 2017-03-03 DIAGNOSIS — R109 Unspecified abdominal pain: Secondary | ICD-10-CM | POA: Diagnosis not present

## 2017-03-03 DIAGNOSIS — I10 Essential (primary) hypertension: Secondary | ICD-10-CM | POA: Diagnosis not present

## 2017-03-03 MED ORDER — MELOXICAM 15 MG PO TABS: 15.0000 mg | ORAL_TABLET | Freq: Every day | ORAL | 0 refills | Status: DC

## 2017-03-03 MED ORDER — KETOROLAC TROMETHAMINE 60 MG/2ML IM SOLN
60.0000 mg | Freq: Once | INTRAMUSCULAR | Status: AC
  Administered 2017-03-03: 60 mg via INTRAMUSCULAR

## 2017-03-03 NOTE — Progress Notes (Signed)
Pre visit review using our clinic review tool, if applicable. No additional management support is needed unless otherwise documented below in the visit note. 
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
## 2017-03-03 NOTE — Patient Instructions (Addendum)
Keep up the good work with your blood pressure.  Your lot number and NDC number on your medicine bottle do not appear to be on the recall list. Call your pharmacy to double check.   Aim to do some physical exertion for 150 minutes per week. This is typically divided into 5 days per week, 30 minutes per day. The activity should be enough to get your heart rate up. Anything is better than nothing if you have time constraints. Try to lift some weights.  Great work with the soda moderation.  Heat (pad or rice pillow in microwave) over affected area, 10-15 minutes every 2-3 hours while awake.   Ice/cold pack over area for 10-15 min every 2-3 hours while awake.

## 2017-03-03 NOTE — Addendum Note (Signed)
Addended by: Sharon Seller B on: 03/03/2017 04:44 PM   Modules accepted: Orders

## 2017-04-06 ENCOUNTER — Other Ambulatory Visit: Payer: Self-pay | Admitting: Family Medicine

## 2017-05-14 ENCOUNTER — Ambulatory Visit: Payer: Federal, State, Local not specified - PPO | Admitting: Family Medicine

## 2017-05-14 ENCOUNTER — Encounter: Payer: Self-pay | Admitting: Family Medicine

## 2017-05-14 VITALS — BP 140/90 | HR 74 | Temp 98.0°F | Ht 64.0 in | Wt 189.1 lb

## 2017-05-14 DIAGNOSIS — R030 Elevated blood-pressure reading, without diagnosis of hypertension: Secondary | ICD-10-CM | POA: Diagnosis not present

## 2017-05-14 DIAGNOSIS — G44209 Tension-type headache, unspecified, not intractable: Secondary | ICD-10-CM | POA: Diagnosis not present

## 2017-05-14 MED ORDER — KETOROLAC TROMETHAMINE 60 MG/2ML IM SOLN
60.0000 mg | Freq: Once | INTRAMUSCULAR | Status: AC
  Administered 2017-05-14: 60 mg via INTRAMUSCULAR
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
## 2017-05-14 NOTE — Addendum Note (Signed)
Addended by: Sharon Seller B on: 05/14/2017 02:55 PM   Modules accepted: Orders

## 2017-05-14 NOTE — Patient Instructions (Addendum)
No NSAID's for the rest of the day.   Ibuprofen 400-600 mg (2-3 over the counter strength tabs) every 6 hours as needed for pain.  OK to take Tylenol 1000 mg (2 extra strength tabs) or 975 mg (3 regular strength tabs) every 6 hours as needed.  Ice/cold pack over area for 10-15 min every 2-3 hours while awake.  Let us know if you need anything.

## 2017-05-14 NOTE — Progress Notes (Signed)
Pre visit review using our clinic review tool, if applicable. No additional management support is needed unless otherwise documented below in the visit note. 

## 2017-06-05 ENCOUNTER — Telehealth: Payer: Self-pay | Admitting: Family Medicine

## 2017-06-05 MED ORDER — NAPROXEN 500 MG PO TABS: 500.0000 mg | ORAL_TABLET | Freq: Two times a day (BID) | ORAL | 0 refills | Status: DC

## 2017-06-05 NOTE — Telephone Encounter (Signed)
Copied from [place]. Topic: General - Other >> Jun 05, 2017  8:45 AM Hannah Conley wrote:  Pt call to say she is still having headaches and would like to know if the doctor would call her in something  [place]

## 2017-06-05 NOTE — Telephone Encounter (Signed)
Patient informed of PCP instructions. She agreed to do.

## 2017-06-05 NOTE — Telephone Encounter (Signed)
Called something in, if still no better, should come back to discuss other options and be reevaluated. TY.

## 2017-06-13 ENCOUNTER — Ambulatory Visit: Payer: Federal, State, Local not specified - PPO | Admitting: Family Medicine

## 2017-06-13 ENCOUNTER — Encounter: Payer: Self-pay | Admitting: Family Medicine

## 2017-06-13 VITALS — BP 146/91 | HR 80 | Temp 98.5°F | Resp 16 | Ht 64.0 in | Wt 193.2 lb

## 2017-06-13 DIAGNOSIS — I10 Essential (primary) hypertension: Secondary | ICD-10-CM

## 2017-06-13 LAB — BASIC METABOLIC PANEL
BUN: 8 mg/dL (ref 6–23)
CO2: 28 mEq/L (ref 19–32)
Calcium: 8.8 mg/dL (ref 8.4–10.5)
Chloride: 106 mEq/L (ref 96–112)
Creatinine, Ser: 0.77 mg/dL (ref 0.40–1.20)
GFR: 100.75 mL/min (ref >60.00)
GLUCOSE: 105 mg/dL — AB (ref 70–99)
POTASSIUM: 3.6 meq/L (ref 3.5–5.1)
Sodium: 140 mEq/L (ref 135–145)

## 2017-06-13 MED ORDER — CHLORTHALIDONE 25 MG PO TABS: 12.5000 mg | ORAL_TABLET | Freq: Every day | ORAL | 1 refills | Status: DC

## 2017-06-13 NOTE — Patient Instructions (Addendum)
Your goal weight loss is 3 lbs by the time I see you next.    Keep checking your blood pressures at home.   You don't need to be fasting for your labs.   Let us know if you need anything.
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
## 2017-06-16 ENCOUNTER — Other Ambulatory Visit: Payer: Self-pay | Admitting: Family Medicine

## 2017-06-28 ENCOUNTER — Other Ambulatory Visit: Payer: Self-pay | Admitting: Family Medicine

## 2017-07-10 ENCOUNTER — Ambulatory Visit: Payer: Federal, State, Local not specified - PPO | Admitting: Family Medicine

## 2017-07-10 ENCOUNTER — Encounter: Payer: Self-pay | Admitting: Family Medicine

## 2017-07-10 VITALS — BP 132/76 | HR 70 | Temp 99.2°F | Ht 64.0 in | Wt 191.0 lb

## 2017-07-10 DIAGNOSIS — K529 Noninfective gastroenteritis and colitis, unspecified: Secondary | ICD-10-CM

## 2017-07-10 MED ORDER — ONDANSETRON HCL 4 MG PO TABS: 4.0000 mg | ORAL_TABLET | Freq: Three times a day (TID) | ORAL | 0 refills | Status: DC | PRN

## 2017-07-10 NOTE — Patient Instructions (Signed)
Keep pushing fluids- Pedialyte, Gatorade (G2), Powerade as long as your are throwing up or having diarrhea.  Use Lysol. Wash your hands frequently with soap and water.   Let us know if you need anything.
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
## 2017-07-10 NOTE — Progress Notes (Signed)
Pre visit review using our clinic review tool, if applicable. No additional management support is needed unless otherwise documented below in the visit note. 

## 2017-07-18 ENCOUNTER — Ambulatory Visit: Payer: Federal, State, Local not specified - PPO | Admitting: Family Medicine

## 2017-07-18 ENCOUNTER — Encounter: Payer: Self-pay | Admitting: Family Medicine

## 2017-07-18 DIAGNOSIS — I10 Essential (primary) hypertension: Secondary | ICD-10-CM

## 2017-07-18 MED ORDER — CHLORTHALIDONE 25 MG PO TABS: 12.5000 mg | ORAL_TABLET | Freq: Every day | ORAL | 1 refills | Status: DC

## 2017-07-18 MED ORDER — AMLODIPINE BESYLATE-VALSARTAN 5-320 MG PO TABS: 1.0000 | ORAL_TABLET | Freq: Every day | ORAL | 1 refills | Status: DC

## 2017-07-18 NOTE — Progress Notes (Signed)
Pre visit review using our clinic review tool, if applicable. No additional management support is needed unless otherwise documented below in the visit note. 
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
## 2017-07-18 NOTE — Patient Instructions (Signed)
Keep checking BP.   Aim to do some physical exertion for 150 minutes per week. This is typically divided into 5 days per week, 30 minutes per day. The activity should be enough to get your heart rate up. Anything is better than nothing if you have time constraints.  Youtube yoga videos. You can do that at home.   Let us know if you need anything.

## 2017-07-21 DIAGNOSIS — H1033 Unspecified acute conjunctivitis, bilateral: Secondary | ICD-10-CM | POA: Diagnosis not present

## 2017-07-25 ENCOUNTER — Other Ambulatory Visit (INDEPENDENT_AMBULATORY_CARE_PROVIDER_SITE_OTHER): Payer: Federal, State, Local not specified - PPO

## 2017-07-25 DIAGNOSIS — I10 Essential (primary) hypertension: Secondary | ICD-10-CM

## 2017-07-25 LAB — BASIC METABOLIC PANEL
BUN: 9 mg/dL (ref 6–23)
CHLORIDE: 103 meq/L (ref 96–112)
CO2: 32 mEq/L (ref 19–32)
Calcium: 9.2 mg/dL (ref 8.4–10.5)
Creatinine, Ser: 0.75 mg/dL (ref 0.40–1.20)
GFR: 103.81 mL/min (ref >60.00)
Glucose, Bld: 87 mg/dL (ref 70–99)
Potassium: 3.7 mEq/L (ref 3.5–5.1)
Sodium: 139 mEq/L (ref 135–145)

## 2017-07-26 ENCOUNTER — Other Ambulatory Visit: Payer: Self-pay | Admitting: Family Medicine

## 2017-08-15 DIAGNOSIS — H04123 Dry eye syndrome of bilateral lacrimal glands: Secondary | ICD-10-CM | POA: Diagnosis not present

## 2017-08-22 ENCOUNTER — Other Ambulatory Visit: Payer: Self-pay | Admitting: Family Medicine

## 2017-08-29 ENCOUNTER — Encounter: Payer: Self-pay | Admitting: Family Medicine

## 2017-08-29 ENCOUNTER — Ambulatory Visit: Payer: Federal, State, Local not specified - PPO | Admitting: Family Medicine

## 2017-08-29 VITALS — BP 132/88 | HR 79 | Temp 98.0°F | Ht 64.0 in | Wt 192.5 lb

## 2017-08-29 DIAGNOSIS — M940 Chondrocostal junction syndrome [Tietze]: Secondary | ICD-10-CM

## 2017-08-29 DIAGNOSIS — I10 Essential (primary) hypertension: Secondary | ICD-10-CM

## 2017-08-29 MED ORDER — DICLOFENAC SODIUM 75 MG PO TBEC: 75.0000 mg | DELAYED_RELEASE_TABLET | Freq: Two times a day (BID) | ORAL | 0 refills | Status: DC

## 2017-08-29 NOTE — Progress Notes (Signed)
Pre visit review using our clinic review tool, if applicable. No additional management support is needed unless otherwise documented below in the visit note. 
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
## 2017-08-29 NOTE — Patient Instructions (Addendum)
Costochondritis Costochondritis is swelling and irritation (inflammation) of the tissue (cartilage) that connects your ribs to your breastbone (sternum). This causes pain in the front of your chest. The pain usually starts gradually and involves more than one rib. What are the causes? The exact cause of this condition is not always known. It results from stress on the cartilage where your ribs attach to your sternum. The cause of this stress could be:  Chest injury (trauma).  Exercise or activity, such as lifting.  Severe coughing.  What increases the risk? You may be at higher risk for this condition if you:  Are female.  Are 30?54 years old.  Recently started a new exercise or work activity.  Have low levels of vitamin D.  Have a condition that makes you cough frequently.  What are the signs or symptoms? The main symptom of this condition is chest pain. The pain:  Usually starts gradually and can be sharp or dull.  Gets worse with deep breathing, coughing, or exercise.  Gets better with rest.  May be worse when you press on the sternum-rib connection (tenderness).  How is this diagnosed? This condition is diagnosed based on your symptoms, medical history, and a physical exam. Your health care provider will check for tenderness when pressing on your sternum. This is the most important finding. You may also have tests to rule out other causes of chest pain. These may include:  A chest X-ray to check for lung problems.  An electrocardiogram (ECG) to see if you have a heart problem that could be causing the pain.  An imaging scan to rule out a chest or rib fracture.  How is this treated? This condition usually goes away on its own over time. Your health care provider may prescribe an NSAID to reduce pain and inflammation. Your health care provider may also suggest that you:  Rest and avoid activities that make pain worse.  Apply heat or cold to the area to reduce pain  and inflammation.  Do exercises to stretch your chest muscles.  If these treatments do not help, your health care provider may inject a numbing medicine at the sternum-rib connection to help relieve the pain. Follow these instructions at home:  Avoid activities that make pain worse. This includes any activities that use chest, abdominal, and side muscles.  If directed, put ice on the painful area: ? Put ice in a plastic bag. ? Place a towel between your skin and the bag. ? Leave the ice on for 20 minutes, 2-3 times a day.  If directed, apply heat to the affected area as often as told by your health care provider. Use the heat source that your health care provider recommends, such as a moist heat pack or a heating pad. ? Place a towel between your skin and the heat source. ? Leave the heat on for 20-30 minutes. ? Remove the heat if your skin turns bright red. This is especially important if you are unable to feel pain, heat, or cold. You may have a greater risk of getting burned.  Take over-the-counter and prescription medicines only as told by your health care provider.  Return to your normal activities as told by your health care provider. Ask your health care provider what activities are safe for you.  Keep all follow-up visits as told by your health care provider. This is important. Contact a health care provider if:  You have chills or a fever.  Your pain does not go   away or it gets worse.  You have a cough that does not go away (is persistent). Get help right away if:  You have shortness of breath. This information is not intended to replace advice given to you by your health care provider. Make sure you discuss any questions you have with your health care provider. Document Released: 03/13/2005 Document Revised: 12/22/2015 Document Reviewed: 09/27/2015 Elsevier Interactive Patient Education  2018 [employer] your blood pressure monitor to your next visit.

## 2017-09-12 ENCOUNTER — Ambulatory Visit: Payer: Federal, State, Local not specified - PPO | Admitting: Family Medicine

## 2017-09-12 DIAGNOSIS — Z0289 Encounter for other administrative examinations: Secondary | ICD-10-CM

## 2017-09-17 ENCOUNTER — Ambulatory Visit: Payer: Federal, State, Local not specified - PPO | Admitting: Family Medicine

## 2017-09-22 ENCOUNTER — Encounter: Payer: Self-pay | Admitting: Family Medicine

## 2017-09-24 ENCOUNTER — Other Ambulatory Visit: Payer: Self-pay | Admitting: Family Medicine

## 2017-09-24 DIAGNOSIS — M940 Chondrocostal junction syndrome [Tietze]: Secondary | ICD-10-CM

## 2017-09-25 ENCOUNTER — Ambulatory Visit: Payer: Self-pay

## 2017-09-25 ENCOUNTER — Encounter: Payer: Self-pay | Admitting: Family Medicine

## 2017-09-25 ENCOUNTER — Ambulatory Visit: Payer: Federal, State, Local not specified - PPO | Admitting: Family Medicine

## 2017-09-25 VITALS — BP 148/84 | HR 83 | Temp 98.3°F | Ht 64.0 in | Wt 192.0 lb

## 2017-09-25 DIAGNOSIS — R6889 Other general symptoms and signs: Secondary | ICD-10-CM | POA: Diagnosis not present

## 2017-09-25 DIAGNOSIS — I10 Essential (primary) hypertension: Secondary | ICD-10-CM | POA: Diagnosis not present

## 2017-09-25 DIAGNOSIS — D649 Anemia, unspecified: Secondary | ICD-10-CM | POA: Diagnosis not present

## 2017-09-25 DIAGNOSIS — Z880 Allergy status to penicillin: Secondary | ICD-10-CM | POA: Diagnosis not present

## 2017-09-25 DIAGNOSIS — R252 Cramp and spasm: Secondary | ICD-10-CM | POA: Diagnosis not present

## 2017-09-25 DIAGNOSIS — Z79899 Other long term (current) drug therapy: Secondary | ICD-10-CM | POA: Diagnosis not present

## 2017-09-25 DIAGNOSIS — R202 Paresthesia of skin: Secondary | ICD-10-CM

## 2017-09-25 DIAGNOSIS — R7989 Other specified abnormal findings of blood chemistry: Secondary | ICD-10-CM | POA: Diagnosis not present

## 2017-09-25 DIAGNOSIS — Z885 Allergy status to narcotic agent status: Secondary | ICD-10-CM | POA: Diagnosis not present

## 2017-09-25 DIAGNOSIS — M79604 Pain in right leg: Secondary | ICD-10-CM | POA: Diagnosis not present

## 2017-09-25 DIAGNOSIS — M79661 Pain in right lower leg: Secondary | ICD-10-CM | POA: Diagnosis not present

## 2017-09-25 NOTE — Patient Instructions (Addendum)
Take ferrous sulfate 3 times per day. This could upset your stomach or dark/tarry stools.   Try pickle juice at night, a spoonful.   Labs could take up to 1 week to get back. No news is good news.   Let us know if you need anything.
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
## 2017-09-25 NOTE — Telephone Encounter (Signed)
FYI to PCP

## 2017-09-25 NOTE — Telephone Encounter (Signed)
Call placed to pt. To verify she arrived at ER safely.  Pt. Confirmed she was at the ER and being eval. By the nurse.

## 2017-09-25 NOTE — Telephone Encounter (Signed)
Pt. Called with c/o feeling extremely cold all over; "I'm freezing."  Reported she is sweaty even though she is so cold.  Stated her right leg went numb yesterday.  Denied pain in right leg.  C/o intermittent "tingling, cramping and drawing-up" of toes in both feet. C/o pain and immobility of feet during the episodes of cramping/ contracting.  Denied dizziness, speech difficulties, numbness of right upper extremity.  Stated it is difficult to walk, due to the numbness of the right lower extremity.  Reported she has had a "bad headache" since Tuesday, with some decrease in the headache today.  Advised to go to the ER with onset of numbness in right leg.  Pt. Stated she is in her car and will go to the ER, within 5 minutes of her.       Reason for Disposition . [1] Numbness (i.e., loss of sensation) of the face, arm / hand, or leg / foot on one side of the body AND [2] sudden onset AND [3] present now  Answer Assessment - Initial Assessment Questions 1. SYMPTOM: "What is the main symptom you are concerned about?" (e.g., weakness, numbness)     Numbness in right leg  2. ONSET: "When did this start?" (minutes, hours, days; while sleeping)     Yesterday  3. LAST NORMAL: "When was the last time you were normal (no symptoms)?"     About a week ago 4. PATTERN "Does this come and go, or has it been constant since it started?"  "Is it present now?"   Intermittent with feet tingling cramping; and continuous right leg numb; skin is moist 5. CARDIAC SYMPTOMS: "Have you had any of the following symptoms: chest pain, difficulty breathing, palpitations?"    Hx. Heart murmur; denies chest pain, SOB, palpitations  6. NEUROLOGIC SYMPTOMS: "Have you had any of the following symptoms: headache, dizziness, vision loss, double vision, changes in speech, unsteady on your feet?"     Denies dizziness, vision loss, speech problems; c/o bad headache past 2 days; toes drawing up and cramping in feet 7. OTHER SYMPTOMS: "Do you  have any other symptoms?"     overall feeling cold; "freezing"; worsened over past week; feet tingling/ cramping; toes drawing up  8. PREGNANCY: "Is there any chance you are pregnant?" "When was your last menstrual period?"     No; Menopausal  Protocols used: NEUROLOGIC DEFICIT-A-AH

## 2017-09-25 NOTE — Progress Notes (Signed)
Pre visit review using our clinic review tool, if applicable. No additional management support is needed unless otherwise documented below in the visit note. 

## 2017-09-26 LAB — IBC PANEL
Iron: 65 ug/dL (ref 42–145)
SATURATION RATIOS: 17.6 % — AB (ref 20.0–50.0)
TRANSFERRIN: 264 mg/dL (ref 212.0–360.0)

## 2017-09-26 LAB — PATHOLOGIST SMEAR REVIEW

## 2017-09-26 LAB — MAGNESIUM: Magnesium: 2.1 mg/dL (ref 1.5–2.5)

## 2017-09-26 LAB — LACTATE DEHYDROGENASE: LDH: 156 U/L (ref 120–250)

## 2017-09-26 LAB — CBC WITH DIFFERENTIAL/PLATELET
BASOS ABS: 0.1 10*3/uL (ref 0.0–0.1)
Basophils Relative: 1.1 % (ref 0.0–3.0)
Eosinophils Absolute: 0.1 10*3/uL (ref 0.0–0.7)
Eosinophils Relative: 1.5 % (ref 0.0–5.0)
HCT: 38.2 % (ref 36.0–46.0)
Hemoglobin: 12.8 g/dL (ref 12.0–15.0)
Lymphocytes Relative: 54.1 % — ABNORMAL HIGH (ref 12.0–46.0)
Lymphs Abs: 2.7 10*3/uL (ref 0.7–4.0)
MCHC: 33.5 g/dL (ref 30.0–36.0)
MCV: 91.7 fl (ref 78.0–100.0)
MONO ABS: 0.5 10*3/uL (ref 0.1–1.0)
Monocytes Relative: 9.2 % (ref 3.0–12.0)
NEUTROS ABS: 1.7 10*3/uL (ref 1.4–7.7)
Neutrophils Relative %: 34.1 % — ABNORMAL LOW (ref 43.0–77.0)
PLATELETS: 322 10*3/uL (ref 150.0–400.0)
RBC: 4.16 Mil/uL (ref 3.87–5.11)
RDW: 13.5 % (ref 11.5–15.5)
WBC: 5 10*3/uL (ref 4.0–10.5)

## 2017-09-26 LAB — HEMOGLOBIN A1C: Hgb A1c MFr Bld: 5.7 % (ref 4.6–6.5)

## 2017-09-26 LAB — VITAMIN B12: Vitamin B-12: 128 pg/mL — ABNORMAL LOW (ref 211–911)

## 2017-09-26 LAB — FERRITIN: Ferritin: 23.7 ng/mL (ref 10.0–291.0)

## 2017-09-26 LAB — T4, FREE: Free T4: 0.72 ng/dL (ref 0.60–1.60)

## 2017-09-26 LAB — HAPTOGLOBIN: HAPTOGLOBIN: 146 mg/dL (ref 43–212)

## 2017-10-01 ENCOUNTER — Encounter: Payer: Self-pay | Admitting: Family Medicine

## 2017-10-01 ENCOUNTER — Ambulatory Visit: Payer: Federal, State, Local not specified - PPO | Admitting: Family Medicine

## 2017-10-01 VITALS — BP 130/84 | HR 70 | Temp 98.4°F | Resp 18 | Ht 64.0 in | Wt 194.2 lb

## 2017-10-01 DIAGNOSIS — E611 Iron deficiency: Secondary | ICD-10-CM | POA: Diagnosis not present

## 2017-10-01 DIAGNOSIS — E538 Deficiency of other specified B group vitamins: Secondary | ICD-10-CM

## 2017-10-01 DIAGNOSIS — M25532 Pain in left wrist: Secondary | ICD-10-CM | POA: Diagnosis not present

## 2017-10-01 MED ORDER — MELOXICAM 15 MG PO TABS: 15.0000 mg | ORAL_TABLET | Freq: Every day | ORAL | 0 refills | Status: DC

## 2017-10-01 NOTE — Patient Instructions (Addendum)
Heat (pad or rice pillow in microwave) over affected area, 10-15 minutes twice daily.   Ice/cold pack over area for 10-15 min twice daily.  OK to take Tylenol 1000 mg (2 extra strength tabs) or 975 mg (3 regular strength tabs) every 6 hours as needed.  Wear the brace at night and when you are using your wrists.    Wrist and Forearm Exercises Do exercises exactly as told by your health care provider and adjust them as directed. It is normal to feel mild stretching, pulling, tightness, or discomfort as you do these exercises, but you should stop right away if you feel sudden pain or your pain gets worse.   RANGE OF MOTION EXERCISES These exercises warm up your muscles and joints and improve the movement and flexibility of your injured wrist and forearm. These exercises also help to relieve pain, numbness, and tingling. These exercises are done using the muscles in your injured wrist and forearm. Exercise A: Wrist Flexion, Active 1. With your fingers relaxed, bend your wrist forward as far as you can. 2. Hold this position for 30 seconds. Repeat 2 times. Complete this exercise 3 times per week. Exercise B: Wrist Extension, Active 1. With your fingers relaxed, bend your wrist backward as far as you can. 2. Hold this position for 30 seconds. Repeat 2 times. Complete this exercise 3 times per week. Exercise C: Supination, Active  1. Stand or sit with your arms at your sides. 2. Bend your left / right elbow to an "L" shape (90 degrees). 3. Turn your palm upward until you feel a gentle stretch on the inside of your forearm. 4. Hold this position for 30 seconds. 5. Slowly return your palm to the starting position. Repeat 2 times. Complete this exercise 3 times per week. Exercise D: Pronation, Active  1. Stand or sit with your arms at your sides. 2. Bend your left / right elbow to an "L" shape (90 degrees). 3. Turn your palm downward until you feel a gentle stretch on the top of your  forearm. 4. Hold this position for 30 seconds. 5. Slowly return your palm to the starting position. Repeat 2 times. Complete this exercise once a day.  STRETCHING EXERCISES These exercises warm up your muscles and joints and improve the movement and flexibility of your injured wrist and forearm. These exercises also help to relieve pain, numbness, and tingling. These exercises are done using your healthy wrist and forearm to help stretch the muscles in your injured wrist and forearm. Exercise E: Wrist Flexion, Passive  1. Extend your left / right arm in front of you, relax your wrist, and point your fingers downward. 2. Gently push on the back of your hand. Stop when you feel a gentle stretch on the top of your forearm. 3. Hold this position for 30 seconds. Repeat 2 times. Complete this exercise 3 times per week. Exercise F: Wrist Extension, Passive  1. Extend your left / right arm in front of you and turn your palm upward. 2. Gently pull your palm and fingertips back so your fingers point downward. You should feel a gentle stretch on the palm-side of your forearm. 3. Hold this position for 30 seconds. Repeat 2 times. Complete this exercise 3 times per week. Exercise G: Forearm Rotation, Supination, Passive 1. Sit with your left / right elbow bent to an "L" shape (90 degrees) with your forearm resting on a table. 2. Keeping your upper body and shoulder still, use your other hand  to rotate your forearm palm-up until you feel a gentle to moderate stretch. 3. Hold this position for 30 seconds. 4. Slowly release the stretch and return to the starting position. Repeat 2 times. Complete this exercise 3 times per week. Exercise H: Forearm Rotation, Pronation, Passive 1. Sit with your left / right elbow bent to an "L" shape (90 degrees) with your forearm resting on a table. 2. Keeping your upper body and shoulder still, use your other hand to rotate your forearm palm-down until you feel a gentle  to moderate stretch. 3. Hold this position for 30 seconds. 4. Slowly release the stretch and return to the starting position. Repeat 2 times. Complete this exercise 3 times per week.  STRENGTHENING EXERCISES These exercises build strength and endurance in your wrist and forearm. Endurance is the ability to use your muscles for a long time, even after they get tired. Exercise I: Wrist Flexors  1. Sit with your left / right forearm supported on a table and your hand resting palm-up over the edge of the table. Your elbow should be bent to an "L" shape (about 90 degrees) and be below the level of your shoulder. 2. Hold a 3-5 lb weight in your left / right hand. Or, hold a rubber exercise band or tube in both hands, keeping your hands at the same level and hip distance apart. There should be a slight tension in the exercise band or tube. 3. Slowly curl your hand up toward your forearm. 4. Hold this position for 3 seconds. 5. Slowly lower your hand back to the starting position. Repeat 2 times. Complete this exercise 3 times per week. Exercise J: Wrist Extensors  1. Sit with your left / right forearm supported on a table and your hand resting palm-down over the edge of the table. Your elbow should be bent to an "L" shape (about 90 degrees) and be below the level of your shoulder. 2. Hold a 3-5 lb weight in your left / right hand. Or, hold a rubber exercise band or tube in both hands, keeping your hands at the same level and hip distance apart. There should be a slight tension in the exercise band or tube. 3. Slowly curl your hand up toward your forearm. 4. Hold this position for 3 seconds. 5. Slowly lower your hand back to the starting position. Repeat 2 times. Complete this exercise 3 times per week. Exercise K: Forearm Rotation, Supination  1. Sit with your left / right forearm supported on a table and your hand resting palm-down. Your elbow should be at your side, bent to an "L" shape (about 90  degrees), and below the level of your shoulder. Keep your wrist stable and in a neutral position throughout the exercise. 2. Gently hold a lightweight hammer with your left / right hand. 3. Without moving your elbow or wrist, slowly rotate your palm upward to a thumbs-up position. 4. Hold this position for 3 seconds. 5. Slowly return your forearm to the starting position. Repeat 2 times. Complete this exercise 3 times per week. Exercise L: Forearm Rotation, Pronation  1. Sit with your left / right forearm supported on a table and your hand resting palm-up. Your elbow should be at your side, bent to an "L" shape (about 90 degrees), and below the level of your shoulder. Keep your wrist stable. Do not allow it to move backward or forward during the exercise. 2. Gently hold a lightweight hammer with your left / right hand. 3.  Without moving your elbow or wrist, slowly rotate your palm and hand upward to a thumbs-up position. 4. Hold this position for 3 seconds. 5. Slowly return your forearm to the starting position. Repeat 2 times. Complete this exercise 3 times per week. Exercise M: Grip Strengthening  1. Hold one of these items in your left / right hand: play dough, therapy putty, a dense sponge, a stress ball, or a large, rolled sock. 2. Squeeze as hard as you can without increasing pain. 3. Hold this position for 5 seconds. 4. Slowly release your grip. Repeat 2 times. Complete this exercise 3 times per week.  This information is not intended to replace advice given to you by your health care provider. Make sure you discuss any questions you have with your health care provider. Document Released: 04/17/2005 Document Revised: 02/26/2016 Document Reviewed: 02/26/2015 Elsevier Interactive Patient Education  [employer].
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
## 2017-10-16 DIAGNOSIS — Z01419 Encounter for gynecological examination (general) (routine) without abnormal findings: Secondary | ICD-10-CM | POA: Diagnosis not present

## 2017-10-16 DIAGNOSIS — Z13820 Encounter for screening for osteoporosis: Secondary | ICD-10-CM | POA: Diagnosis not present

## 2017-10-16 DIAGNOSIS — Z1231 Encounter for screening mammogram for malignant neoplasm of breast: Secondary | ICD-10-CM | POA: Diagnosis not present

## 2017-10-16 DIAGNOSIS — Z6832 Body mass index (BMI) 32.0-32.9, adult: Secondary | ICD-10-CM | POA: Diagnosis not present

## 2017-10-28 ENCOUNTER — Other Ambulatory Visit: Payer: Self-pay | Admitting: Family Medicine

## 2017-10-28 DIAGNOSIS — M25532 Pain in left wrist: Secondary | ICD-10-CM

## 2017-12-03 ENCOUNTER — Ambulatory Visit: Payer: Federal, State, Local not specified - PPO | Admitting: Family Medicine

## 2017-12-03 ENCOUNTER — Encounter: Payer: Self-pay | Admitting: Family Medicine

## 2017-12-03 VITALS — BP 132/86 | HR 70 | Temp 98.4°F | Ht 64.0 in | Wt 193.0 lb

## 2017-12-03 DIAGNOSIS — R252 Cramp and spasm: Secondary | ICD-10-CM

## 2017-12-03 NOTE — Progress Notes (Signed)
Pre visit review using our clinic review tool, if applicable. No additional management support is needed unless otherwise documented below in the visit note. 
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
## 2017-12-03 NOTE — Patient Instructions (Addendum)
Try a spoonful of pickle juice or mustard before bed to help with cramps.   Stay well hydrated.   If you are actively having a cramp, stretch the area to stop the pain/spasm.  Let us know if you need anything.

## 2018-01-09 DIAGNOSIS — H10013 Acute follicular conjunctivitis, bilateral: Secondary | ICD-10-CM | POA: Diagnosis not present

## 2018-01-10 ENCOUNTER — Other Ambulatory Visit: Payer: Self-pay | Admitting: Family Medicine

## 2018-01-10 DIAGNOSIS — I10 Essential (primary) hypertension: Secondary | ICD-10-CM

## 2018-01-31 ENCOUNTER — Emergency Department (HOSPITAL_BASED_OUTPATIENT_CLINIC_OR_DEPARTMENT_OTHER)
Admission: EM | Admit: 2018-01-31 | Discharge: 2018-01-31 | Disposition: A | Discharge status: Home / Self Care | Payer: Federal, State, Local not specified - PPO | Attending: Emergency Medicine | Admitting: Emergency Medicine

## 2018-01-31 ENCOUNTER — Other Ambulatory Visit: Payer: Self-pay

## 2018-01-31 ENCOUNTER — Encounter (HOSPITAL_BASED_OUTPATIENT_CLINIC_OR_DEPARTMENT_OTHER): Payer: Self-pay | Admitting: Emergency Medicine

## 2018-01-31 DIAGNOSIS — L089 Local infection of the skin and subcutaneous tissue, unspecified: Secondary | ICD-10-CM | POA: Diagnosis not present

## 2018-01-31 DIAGNOSIS — Z79899 Other long term (current) drug therapy: Secondary | ICD-10-CM | POA: Insufficient documentation

## 2018-01-31 DIAGNOSIS — S40862A Insect bite (nonvenomous) of left upper arm, initial encounter: Secondary | ICD-10-CM | POA: Diagnosis not present

## 2018-01-31 DIAGNOSIS — R2232 Localized swelling, mass and lump, left upper limb: Secondary | ICD-10-CM | POA: Diagnosis not present

## 2018-01-31 DIAGNOSIS — W57XXXA Bitten or stung by nonvenomous insect and other nonvenomous arthropods, initial encounter: Secondary | ICD-10-CM | POA: Diagnosis not present

## 2018-01-31 DIAGNOSIS — L0889 Other specified local infections of the skin and subcutaneous tissue: Secondary | ICD-10-CM | POA: Diagnosis not present

## 2018-01-31 DIAGNOSIS — I10 Essential (primary) hypertension: Secondary | ICD-10-CM | POA: Insufficient documentation

## 2018-01-31 MED ORDER — CLINDAMYCIN HCL 150 MG PO CAPS
300.0000 mg | ORAL_CAPSULE | Freq: Once | ORAL | Status: AC
  Administered 2018-01-31: 300 mg via ORAL
  Filled 2018-01-31: qty 2

## 2018-01-31 MED ORDER — IBUPROFEN 800 MG PO TABS
800.0000 mg | ORAL_TABLET | Freq: Once | ORAL | Status: AC
  Administered 2018-01-31: 800 mg via ORAL
  Filled 2018-01-31: qty 1

## 2018-01-31 MED ORDER — CLINDAMYCIN HCL 300 MG PO CAPS: 300.0000 mg | ORAL_CAPSULE | Freq: Four times a day (QID) | ORAL | 0 refills | Status: DC

## 2018-01-31 MED ORDER — IBUPROFEN 800 MG PO TABS: 800.0000 mg | ORAL_TABLET | Freq: Three times a day (TID) | ORAL | 0 refills | Status: DC

## 2018-01-31 NOTE — ED Triage Notes (Signed)
Patient states that she was bit by a bug yesterday and now her left arm, left foot and back of her left shoulder yesterday - today the bites are more painful and it " has heat to it"

## 2018-01-31 NOTE — Discharge Instructions (Addendum)
Take clindamycin four times daily for a week for skin infection.   You likely have a n infected bug bite   Take motrin for pain.   See your doctor  Return to ER if you have worse redness and swelling, fever, purulent discharge
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
## 2018-01-31 NOTE — ED Notes (Signed)
Pt states that she was bit by a fly on her left upper arm. Apparent redness and swelling, with warmth to touch. She states it burns, itches and aches.

## 2018-01-31 NOTE — ED Notes (Signed)
Pt states that she applied alcohol and neosporin approx. 0330 today.

## 2018-02-28 ENCOUNTER — Other Ambulatory Visit: Payer: Self-pay | Admitting: Family Medicine

## 2018-02-28 DIAGNOSIS — I10 Essential (primary) hypertension: Secondary | ICD-10-CM

## 2018-03-17 ENCOUNTER — Ambulatory Visit: Payer: Federal, State, Local not specified - PPO | Admitting: Family Medicine

## 2018-03-17 ENCOUNTER — Ambulatory Visit (HOSPITAL_BASED_OUTPATIENT_CLINIC_OR_DEPARTMENT_OTHER)
Admission: RE | Admit: 2018-03-17 | Discharge: 2018-03-17 | Disposition: A | Discharge status: Home / Self Care | Payer: Federal, State, Local not specified - PPO | Source: Ambulatory Visit | Attending: Family Medicine | Admitting: Family Medicine

## 2018-03-17 ENCOUNTER — Ambulatory Visit: Payer: Self-pay | Admitting: Family Medicine

## 2018-03-17 VITALS — BP 150/92 | HR 71 | Temp 98.6°F | Resp 18 | Wt 195.0 lb

## 2018-03-17 DIAGNOSIS — I10 Essential (primary) hypertension: Secondary | ICD-10-CM | POA: Diagnosis not present

## 2018-03-17 DIAGNOSIS — R109 Unspecified abdominal pain: Secondary | ICD-10-CM | POA: Diagnosis not present

## 2018-03-17 DIAGNOSIS — M25532 Pain in left wrist: Secondary | ICD-10-CM

## 2018-03-17 DIAGNOSIS — M47816 Spondylosis without myelopathy or radiculopathy, lumbar region: Secondary | ICD-10-CM | POA: Diagnosis not present

## 2018-03-17 DIAGNOSIS — E538 Deficiency of other specified B group vitamins: Secondary | ICD-10-CM

## 2018-03-17 LAB — URINALYSIS, ROUTINE W REFLEX MICROSCOPIC
Bilirubin Urine: NEGATIVE
Hgb urine dipstick: NEGATIVE
Ketones, ur: NEGATIVE
NITRITE: NEGATIVE
PH: 7.5 (ref 5.0–8.0)
SPECIFIC GRAVITY, URINE: 1.01 (ref 1.000–1.030)
Total Protein, Urine: NEGATIVE
Urine Glucose: NEGATIVE
Urobilinogen, UA: 0.2 (ref 0.0–1.0)

## 2018-03-17 LAB — COMPREHENSIVE METABOLIC PANEL
ALK PHOS: 89 U/L (ref 39–117)
ALT: 10 U/L (ref 0–35)
AST: 14 U/L (ref 0–37)
Albumin: 4.2 g/dL (ref 3.5–5.2)
BUN: 8 mg/dL (ref 6–23)
CO2: 30 mEq/L (ref 19–32)
Calcium: 9.7 mg/dL (ref 8.4–10.5)
Chloride: 105 mEq/L (ref 96–112)
Creatinine, Ser: 0.82 mg/dL (ref 0.40–1.20)
GFR: 93.42 mL/min (ref >60.00)
GLUCOSE: 96 mg/dL (ref 70–99)
POTASSIUM: 4.1 meq/L (ref 3.5–5.1)
SODIUM: 142 meq/L (ref 135–145)
TOTAL PROTEIN: 7.1 g/dL (ref 6.0–8.3)
Total Bilirubin: 0.7 mg/dL (ref 0.2–1.2)

## 2018-03-17 LAB — CBC WITH DIFFERENTIAL/PLATELET
BASOS ABS: 0 10*3/uL (ref 0.0–0.1)
Basophils Relative: 0.6 % (ref 0.0–3.0)
Eosinophils Absolute: 0 10*3/uL (ref 0.0–0.7)
Eosinophils Relative: 1 % (ref 0.0–5.0)
HCT: 38.7 % (ref 36.0–46.0)
Hemoglobin: 13 g/dL (ref 12.0–15.0)
LYMPHS ABS: 2 10*3/uL (ref 0.7–4.0)
Lymphocytes Relative: 47.3 % — ABNORMAL HIGH (ref 12.0–46.0)
MCHC: 33.6 g/dL (ref 30.0–36.0)
MCV: 90.5 fl (ref 78.0–100.0)
MONO ABS: 0.3 10*3/uL (ref 0.1–1.0)
Monocytes Relative: 7.8 % (ref 3.0–12.0)
NEUTROS PCT: 43.3 % (ref 43.0–77.0)
Neutro Abs: 1.9 10*3/uL (ref 1.4–7.7)
Platelets: 314 10*3/uL (ref 150.0–400.0)
RBC: 4.28 Mil/uL (ref 3.87–5.11)
RDW: 13.7 % (ref 11.5–15.5)
WBC: 4.3 10*3/uL (ref 4.0–10.5)

## 2018-03-17 LAB — VITAMIN B12: VITAMIN B 12: 64 pg/mL — AB (ref 211–911)

## 2018-03-17 MED ORDER — MELOXICAM 15 MG PO TABS: 15.0000 mg | ORAL_TABLET | Freq: Every day | ORAL | 0 refills | Status: DC | PRN

## 2018-03-17 MED ORDER — TIZANIDINE HCL 4 MG PO TABS: 4.0000 mg | ORAL_TABLET | Freq: Four times a day (QID) | ORAL | 0 refills | Status: DC | PRN

## 2018-03-17 NOTE — Telephone Encounter (Signed)
Pt called c/o severe right side and mid lower back pain. Pt stated that the right side and back pain  has been a problem since March. Pt stated that she has seen PCP for the same issue twice. Pt stated that she has tried Tylenol at home without relief. Pt denies other symptoms such as urinary symptoms, fever, abdominal pain, vomiting, leg weakness. Pt was given appointment  by agent for 0845 this am. Care advice given and pt verbalized understanding.   Reason for Disposition . [1] SEVERE back pain (e.g., excruciating, unable to do any normal activities) AND [2] not improved 2 hours after pain medicine    Pt rates pain a 9.5  Additional Information . Commented on: [1] SEVERE pain (e.g., excruciating, scale 8-10) AND [2] present > 1 hour    Pain has been present since march  Answer Assessment - Initial Assessment Questions 1. LOCATION: "Where does it hurt?" (e.g., left, right)     Right side back 2. ONSET: "When did the pain start?"     Since March 3. SEVERITY: "How bad is the pain?" (e.g., Scale 1-10; mild, moderate, or severe)   - MILD (1-3): doesn't interfere with normal activities    - MODERATE (4-7): interferes with normal activities or awakens from sleep    - SEVERE (8-10): excruciating pain and patient unable to do normal activities (stays in bed)       severe 4. PATTERN: "Does the pain come and go, or is it constant?"      Constant  5. CAUSE: "What do you think is causing the pain?"     Pt does not know 6. OTHER SYMPTOMS:  "Do you have any other symptoms?" (e.g., fever, abdominal pain, vomiting, leg weakness, burning with urination, blood in urine)   no 7. PREGNANCY:  "Is there any chance you are pregnant?" "When was your last menstrual period?"     n/a  Answer Assessment - Initial Assessment Questions 1. ONSET: "When did the pain begin?"      March 2. LOCATION: "Where does it hurt?" (upper, mid or lower back)     Mid lower back to right side 3. SEVERITY: "How bad is the  pain?"  (e.g., Scale 1-10; mild, moderate, or severe)   - MILD (1-3): doesn't interfere with normal activities    - MODERATE (4-7): interferes with normal activities or awakens from sleep    - SEVERE (8-10): excruciating pain, unable to do any normal activities      Severe-sharp  4. PATTERN: "Is the pain constant?" (e.g., yes, no; constant, intermittent)      constant 5. RADIATION: "Does the pain shoot into your legs or elsewhere?"     no 6. CAUSE:  "What do you think is causing the back pain?"      Pt doesn't know 7. BACK OVERUSE:  "Any recent lifting of heavy objects, strenuous work or exercise?"     no 8. MEDICATIONS: "What have you taken so far for the pain?" (e.g., nothing, acetaminophen, NSAIDS)     Tylenol without relief 9. NEUROLOGIC SYMPTOMS: "Do you have any weakness, numbness, or problems with bowel/bladder control?"     no 10. OTHER SYMPTOMS: "Do you have any other symptoms?" (e.g., fever, abdominal pain, burning with urination, blood in urine)       none 11. PREGNANCY: "Is there any chance you are pregnant?" (e.g., yes, no; LMP)       n/a  Protocols used: BACK PAIN-A-AH, FLANK PAIN-A-AH

## 2018-03-17 NOTE — Patient Instructions (Signed)
Lidocaine gel or patches by Aspercreme, Icy Hot, or Salon Pas Flank Pain, Adult Flank pain is pain that is located on the side of the body between the upper abdomen and the back. This area is called the flank. The pain may occur over a short period of time (acute), or it may be long-term or recurring (chronic). It may be mild or severe. Flank pain can be caused by many things, including:  Muscle soreness or injury.  Kidney stones or kidney disease.  Stress.  A disease of the spine (vertebral disk disease).  A lung infection (pneumonia).  Fluid around the lungs (pulmonary edema).  A skin rash caused by the chickenpox virus (shingles).  Tumors that affect the back of the abdomen.  Gallbladder disease.  Follow these instructions at home:  Drink enough fluid to keep your urine clear or pale yellow.  Rest as told by your health care provider.  Take over-the-counter and prescription medicines only as told by your health care provider.  Keep a journal to track what has caused your flank pain and what has made it feel better.  Keep all follow-up visits as told by your health care provider. This is important. Contact a health care provider if:  Your pain is not controlled with medicine.  You have new symptoms.  Your pain gets worse.  You have a fever.  Your symptoms last longer than 2-3 days.  You have trouble urinating or you are urinating very frequently. Get help right away if:  You have trouble breathing or you are short of breath.  Your abdomen hurts or it is swollen or red.  You have nausea or vomiting.  You feel faint or you pass out.  You have blood in your urine. Summary  Flank pain is pain that is located on the side of the body between the upper abdomen and the back.  The pain may occur over a short period of time (acute), or it may be long-term or recurring (chronic). It may be mild or severe.  Flank pain can be caused by many things.  Contact your  health care provider if your symptoms get worse or they last longer than 2-3 days. This information is not intended to replace advice given to you by your health care provider. Make sure you discuss any questions you have with your health care provider. Document Released: 07/25/2005 Document Revised: 08/16/2016 Document Reviewed: 08/16/2016 Elsevier Interactive Patient Education  2018 [employer].

## 2018-03-18 LAB — URINE CULTURE
MICRO NUMBER: 91177505
SPECIMEN QUALITY: ADEQUATE

## 2018-03-20 ENCOUNTER — Ambulatory Visit (INDEPENDENT_AMBULATORY_CARE_PROVIDER_SITE_OTHER): Payer: Federal, State, Local not specified - PPO

## 2018-03-20 DIAGNOSIS — E538 Deficiency of other specified B group vitamins: Secondary | ICD-10-CM | POA: Diagnosis not present

## 2018-03-20 MED ORDER — CYANOCOBALAMIN 1000 MCG/ML IJ SOLN
1000.0000 ug | Freq: Once | INTRAMUSCULAR | Status: AC
  Administered 2018-03-20: 1000 ug via INTRAMUSCULAR

## 2018-03-20 NOTE — Progress Notes (Signed)
Pt here for monthly B12 injection per Dr. Charlett Blake and Dr. Nani Ravens  B12 1045mcg given IM in left deltoid, and pt tolerated injection well.  Next B12 injection scheduled for 03/27/18 at 4:00.

## 2018-03-22 DIAGNOSIS — E538 Deficiency of other specified B group vitamins: Secondary | ICD-10-CM | POA: Insufficient documentation

## 2018-03-22 DIAGNOSIS — R109 Unspecified abdominal pain: Secondary | ICD-10-CM | POA: Insufficient documentation

## 2018-03-22 DIAGNOSIS — M25532 Pain in left wrist: Secondary | ICD-10-CM | POA: Insufficient documentation

## 2018-03-22 LAB — INTRINSIC FACTOR ANTIBODIES: INTRINSIC FACTOR: POSITIVE — AB

## 2018-03-22 NOTE — Assessment & Plan Note (Signed)
Encouraged moist heat and ice as tolerated. May try NSAIDs and prescription meds as directed and report if symptoms worsen or seek immediate care
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
## 2018-03-22 NOTE — Assessment & Plan Note (Signed)
Right flank predominantly. Unclear etiology, lumbar xray with only mild changes. Abdominal xray unremarkable. rx given Tizanidine 4 mg qhs prn. Moist neat and repot if worsens or does not resolve for further imaging and referral

## 2018-03-22 NOTE — Assessment & Plan Note (Signed)
Awaiting Intrinsic Factor and started on IM shots

## 2018-03-22 NOTE — Assessment & Plan Note (Signed)
Well controlled, no changes to meds. Encouraged heart healthy diet such as the DASH diet and exercise as tolerated.  °

## 2018-03-26 ENCOUNTER — Ambulatory Visit: Payer: Federal, State, Local not specified - PPO

## 2018-03-27 ENCOUNTER — Ambulatory Visit: Payer: Federal, State, Local not specified - PPO

## 2018-03-27 DIAGNOSIS — D531 Other megaloblastic anemias, not elsewhere classified: Secondary | ICD-10-CM

## 2018-03-27 DIAGNOSIS — E538 Deficiency of other specified B group vitamins: Secondary | ICD-10-CM

## 2018-03-27 MED ORDER — CYANOCOBALAMIN 1000 MCG/ML IJ SOLN: 1000.0000 ug | Freq: Once | INTRAMUSCULAR | Status: DC

## 2018-03-27 NOTE — Progress Notes (Signed)
Pre visit review using our clinic tool,if applicable. No additional management support is needed unless otherwise documented below in the visit note.   Pt here for monthly B12 injection per order from Dr. Nani Ravens.  B12 1034mcg given IM right deltoid, and pt tolerated injection well.  Next B12 injection scheduled for  1 week.

## 2018-04-03 ENCOUNTER — Ambulatory Visit (INDEPENDENT_AMBULATORY_CARE_PROVIDER_SITE_OTHER): Payer: Federal, State, Local not specified - PPO

## 2018-04-03 DIAGNOSIS — E538 Deficiency of other specified B group vitamins: Secondary | ICD-10-CM | POA: Diagnosis not present

## 2018-04-03 MED ORDER — CYANOCOBALAMIN 1000 MCG/ML IJ SOLN
1000.0000 ug | Freq: Once | INTRAMUSCULAR | Status: AC
  Administered 2018-04-03: 1000 ug via INTRAMUSCULAR

## 2018-04-03 NOTE — Progress Notes (Signed)
Pt here for monthly B12 injection per Dr. Nani Ravens   B12 1040mcg given IM, and pt tolerated injection well.  Next B12 injection scheduled for 04/10/18

## 2018-04-10 ENCOUNTER — Ambulatory Visit (INDEPENDENT_AMBULATORY_CARE_PROVIDER_SITE_OTHER): Payer: Federal, State, Local not specified - PPO

## 2018-04-10 DIAGNOSIS — E538 Deficiency of other specified B group vitamins: Secondary | ICD-10-CM | POA: Diagnosis not present

## 2018-04-10 MED ORDER — CYANOCOBALAMIN 1000 MCG/ML IJ SOLN
1000.0000 ug | Freq: Once | INTRAMUSCULAR | Status: AC
  Administered 2018-04-10: 1000 ug via INTRAMUSCULAR

## 2018-04-10 NOTE — Progress Notes (Signed)
Pre visit review using our clinic tool,if applicable. No additional management support is needed unless otherwise documented below in the visit note.   Pt here for monthly B12 injection per   B12 1064mcg given IM right deltoid, and pt tolerated injection well.  Next B12 injection scheduled for 1 week.

## 2018-04-15 ENCOUNTER — Ambulatory Visit (INDEPENDENT_AMBULATORY_CARE_PROVIDER_SITE_OTHER): Payer: Federal, State, Local not specified - PPO

## 2018-04-15 DIAGNOSIS — E538 Deficiency of other specified B group vitamins: Secondary | ICD-10-CM

## 2018-04-15 MED ORDER — CYANOCOBALAMIN 1000 MCG/ML IJ SOLN
1000.0000 ug | Freq: Once | INTRAMUSCULAR | Status: AC
  Administered 2018-04-15: 1000 ug via INTRAMUSCULAR

## 2018-04-15 NOTE — Progress Notes (Signed)
Pt here for monthly B12 injection per   B12 1058mcg given IM lrft deltoid, and pt tolerated injection well.  Next B12 injection scheduled for 05/08/18.

## 2018-05-07 ENCOUNTER — Encounter: Payer: Self-pay | Admitting: Family Medicine

## 2018-05-07 DIAGNOSIS — D510 Vitamin B12 deficiency anemia due to intrinsic factor deficiency: Secondary | ICD-10-CM | POA: Insufficient documentation

## 2018-05-08 ENCOUNTER — Other Ambulatory Visit (INDEPENDENT_AMBULATORY_CARE_PROVIDER_SITE_OTHER): Payer: Federal, State, Local not specified - PPO

## 2018-05-08 ENCOUNTER — Ambulatory Visit (INDEPENDENT_AMBULATORY_CARE_PROVIDER_SITE_OTHER): Payer: Federal, State, Local not specified - PPO

## 2018-05-08 DIAGNOSIS — E538 Deficiency of other specified B group vitamins: Secondary | ICD-10-CM | POA: Diagnosis not present

## 2018-05-08 LAB — VITAMIN B12

## 2018-05-08 MED ORDER — CYANOCOBALAMIN 1000 MCG/ML IJ SOLN
1000.0000 ug | Freq: Once | INTRAMUSCULAR | Status: AC
  Administered 2018-05-08: 1000 ug via INTRAMUSCULAR

## 2018-05-08 NOTE — Addendum Note (Signed)
Addended by: Kelle Darting A on: 05/08/2018 02:55 PM   Modules accepted: Orders

## 2018-05-11 ENCOUNTER — Other Ambulatory Visit: Payer: Self-pay | Admitting: Family Medicine

## 2018-05-11 DIAGNOSIS — D510 Vitamin B12 deficiency anemia due to intrinsic factor deficiency: Secondary | ICD-10-CM

## 2018-05-13 ENCOUNTER — Ambulatory Visit: Payer: Federal, State, Local not specified - PPO | Admitting: Family Medicine

## 2018-05-13 ENCOUNTER — Encounter: Payer: Self-pay | Admitting: Family Medicine

## 2018-05-13 VITALS — BP 142/92 | HR 80 | Temp 97.5°F | Ht 64.0 in | Wt 201.0 lb

## 2018-05-13 DIAGNOSIS — M65341 Trigger finger, right ring finger: Secondary | ICD-10-CM

## 2018-05-13 DIAGNOSIS — R030 Elevated blood-pressure reading, without diagnosis of hypertension: Secondary | ICD-10-CM | POA: Diagnosis not present

## 2018-05-13 DIAGNOSIS — I10 Essential (primary) hypertension: Secondary | ICD-10-CM | POA: Diagnosis not present

## 2018-05-13 DIAGNOSIS — M65321 Trigger finger, right index finger: Secondary | ICD-10-CM | POA: Diagnosis not present

## 2018-05-13 DIAGNOSIS — M65351 Trigger finger, right little finger: Secondary | ICD-10-CM

## 2018-05-13 DIAGNOSIS — M65311 Trigger thumb, right thumb: Secondary | ICD-10-CM

## 2018-05-13 DIAGNOSIS — M65331 Trigger finger, right middle finger: Secondary | ICD-10-CM

## 2018-05-13 MED ORDER — PREDNISONE 20 MG PO TABS: 40.0000 mg | ORAL_TABLET | Freq: Every day | ORAL | 0 refills | Status: AC

## 2018-05-13 NOTE — Patient Instructions (Addendum)
If you do not hear anything about your referrals in the next 1-2 weeks, call our office and ask for an update.  Check blood pressures at home.  Keep the diet clean and stay active.   Let us know if you need anything.
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
## 2018-05-13 NOTE — Progress Notes (Signed)
Pre visit review using our clinic review tool, if applicable. No additional management support is needed unless otherwise documented below in the visit note. 

## 2018-05-18 ENCOUNTER — Encounter: Payer: Self-pay | Admitting: Family Medicine

## 2018-05-18 ENCOUNTER — Ambulatory Visit: Payer: Federal, State, Local not specified - PPO | Admitting: Family Medicine

## 2018-05-18 VITALS — BP 122/86 | HR 77 | Temp 97.8°F | Ht 64.0 in | Wt 195.5 lb

## 2018-05-18 DIAGNOSIS — K529 Noninfective gastroenteritis and colitis, unspecified: Secondary | ICD-10-CM

## 2018-05-18 MED ORDER — ONDANSETRON HCL 4 MG PO TABS: 4.0000 mg | ORAL_TABLET | Freq: Three times a day (TID) | ORAL | 0 refills | Status: DC | PRN

## 2018-05-18 MED ORDER — DICYCLOMINE HCL 10 MG PO CAPS: 10.0000 mg | ORAL_CAPSULE | Freq: Three times a day (TID) | ORAL | 0 refills | Status: DC

## 2018-05-18 NOTE — Patient Instructions (Addendum)
Stay well hydrated. Gatorade, Powerade or Pedialyte while still having loose stools.  If your stools are not better by Wednesday, let me know.  If you are still having issues with blood after this is gone, let me know.  Let us know if you need anything.

## 2018-05-18 NOTE — Progress Notes (Signed)
Pre visit review using our clinic review tool, if applicable. No additional management support is needed unless otherwise documented below in the visit note. 
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
## 2018-06-04 ENCOUNTER — Encounter: Payer: Self-pay | Admitting: Family Medicine

## 2018-06-04 ENCOUNTER — Ambulatory Visit (INDEPENDENT_AMBULATORY_CARE_PROVIDER_SITE_OTHER): Payer: Federal, State, Local not specified - PPO | Admitting: Family Medicine

## 2018-06-04 VITALS — BP 130/80 | HR 73 | Temp 98.6°F | Ht 64.0 in | Wt 200.0 lb

## 2018-06-04 DIAGNOSIS — Z1159 Encounter for screening for other viral diseases: Secondary | ICD-10-CM | POA: Diagnosis not present

## 2018-06-04 DIAGNOSIS — Z2821 Immunization not carried out because of patient refusal: Secondary | ICD-10-CM | POA: Diagnosis not present

## 2018-06-04 DIAGNOSIS — Z114 Encounter for screening for human immunodeficiency virus [HIV]: Secondary | ICD-10-CM

## 2018-06-04 DIAGNOSIS — Z0000 Encounter for general adult medical examination without abnormal findings: Secondary | ICD-10-CM

## 2018-06-04 LAB — COMPREHENSIVE METABOLIC PANEL
ALT: 10 U/L (ref 0–35)
AST: 11 U/L (ref 0–37)
Albumin: 4.4 g/dL (ref 3.5–5.2)
Alkaline Phosphatase: 84 U/L (ref 39–117)
BUN: 9 mg/dL (ref 6–23)
CHLORIDE: 104 meq/L (ref 96–112)
CO2: 29 meq/L (ref 19–32)
Calcium: 9.7 mg/dL (ref 8.4–10.5)
Creatinine, Ser: 0.75 mg/dL (ref 0.40–1.20)
GFR: 103.47 mL/min (ref >60.00)
GLUCOSE: 94 mg/dL (ref 70–99)
Potassium: 4.2 mEq/L (ref 3.5–5.1)
Sodium: 140 mEq/L (ref 135–145)
Total Bilirubin: 0.4 mg/dL (ref 0.2–1.2)
Total Protein: 7.2 g/dL (ref 6.0–8.3)

## 2018-06-04 LAB — LIPID PANEL
CHOL/HDL RATIO: 4
Cholesterol: 161 mg/dL (ref 0–200)
HDL: 43.6 mg/dL (ref >39.00)
LDL Cholesterol: 98 mg/dL (ref 0–99)
NonHDL: 117.62
Triglycerides: 99 mg/dL (ref 0.0–149.0)
VLDL: 19.8 mg/dL (ref 0.0–40.0)

## 2018-06-04 LAB — HEMOGLOBIN A1C: Hgb A1c MFr Bld: 5.9 % (ref 4.6–6.5)

## 2018-06-04 NOTE — Patient Instructions (Addendum)
Give [place] 2-3 business days to get the results of your labs back.   Keep the diet clean and stay active.  Aim to do some physical exertion for 150 minutes per week. This is typically divided into 5 days per week, 30 minutes per day. The activity should be enough to get your heart rate up. Anything is better than nothing if you have time constraints.  Let me know if you change your mind about the flu shot.   Let us know if you need anything.
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
## 2018-06-04 NOTE — Progress Notes (Signed)
Pre visit review using our clinic review tool, if applicable. No additional management support is needed unless otherwise documented below in the visit note. 

## 2018-06-05 LAB — HEPATITIS C ANTIBODY
Hepatitis C Ab: NONREACTIVE
SIGNAL TO CUT-OFF: 0.02 (ref <1.00)

## 2018-06-05 LAB — HIV ANTIBODY (ROUTINE TESTING W REFLEX): HIV: NONREACTIVE

## 2018-06-11 ENCOUNTER — Ambulatory Visit (INDEPENDENT_AMBULATORY_CARE_PROVIDER_SITE_OTHER): Payer: Federal, State, Local not specified - PPO

## 2018-06-11 DIAGNOSIS — E538 Deficiency of other specified B group vitamins: Secondary | ICD-10-CM | POA: Diagnosis not present

## 2018-06-11 MED ORDER — CYANOCOBALAMIN 1000 MCG/ML IJ SOLN
1000.0000 ug | Freq: Once | INTRAMUSCULAR | Status: AC
  Administered 2018-06-11: 1000 ug via INTRAMUSCULAR

## 2018-06-11 NOTE — Progress Notes (Signed)
+  Pre visit review using our clinic tool,if applicable. No additional management support is needed unless otherwise documented below in the visit note.   Pt here for monthly B12 injection per   B12 1049mcg given IM left deltoid, and pt tolerated injection well.  Next B12 injection scheduled for 1 month.

## 2018-06-28 ENCOUNTER — Other Ambulatory Visit: Payer: Self-pay | Admitting: Family Medicine

## 2018-06-28 DIAGNOSIS — K529 Noninfective gastroenteritis and colitis, unspecified: Secondary | ICD-10-CM

## 2018-07-10 ENCOUNTER — Ambulatory Visit (INDEPENDENT_AMBULATORY_CARE_PROVIDER_SITE_OTHER): Payer: Federal, State, Local not specified - PPO

## 2018-07-10 DIAGNOSIS — E538 Deficiency of other specified B group vitamins: Secondary | ICD-10-CM

## 2018-07-10 MED ORDER — CYANOCOBALAMIN 1000 MCG/ML IJ SOLN
1000.0000 ug | Freq: Once | INTRAMUSCULAR | Status: AC
  Administered 2018-07-10: 1000 ug via INTRAMUSCULAR

## 2018-07-10 NOTE — Progress Notes (Addendum)
Pt here for monthly B12 injection per [place]  B12 1031mcg given IM in right deltoid, and pt tolerated injection well.  Next B12 injection scheduled for next month.   Routed to DOD in absence of PCP.   Kathlene November, MD

## 2018-07-15 ENCOUNTER — Other Ambulatory Visit: Payer: Self-pay | Admitting: Family Medicine

## 2018-07-15 ENCOUNTER — Ambulatory Visit: Payer: Federal, State, Local not specified - PPO | Admitting: Family Medicine

## 2018-07-15 ENCOUNTER — Encounter: Payer: Self-pay | Admitting: Family Medicine

## 2018-07-15 VITALS — BP 140/88 | HR 77 | Temp 98.3°F | Ht 64.0 in | Wt 196.1 lb

## 2018-07-15 DIAGNOSIS — I10 Essential (primary) hypertension: Secondary | ICD-10-CM

## 2018-07-15 DIAGNOSIS — L739 Follicular disorder, unspecified: Secondary | ICD-10-CM | POA: Diagnosis not present

## 2018-07-15 DIAGNOSIS — D510 Vitamin B12 deficiency anemia due to intrinsic factor deficiency: Secondary | ICD-10-CM
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
## 2018-07-15 NOTE — Progress Notes (Signed)
Pre visit review using our clinic review tool, if applicable. No additional management support is needed unless otherwise documented below in the visit note. 

## 2018-07-15 NOTE — Patient Instructions (Addendum)
Triple antibiotic twice daily for the next 10 days.  If you still get them, I want to see it when it is at its biggest/most painful.  If the soap doesn't work, try a bleach bath. To prepare a bleach bath, one-fourth to one-half cup of bleach is placed in a full bathtub (about 40 gallons) of water. Bleach baths are usually taken for 5 to 10 minutes twice per week and should be followed by application of an emollient.  Let us know if you need anything.

## 2018-08-06 ENCOUNTER — Other Ambulatory Visit: Payer: Self-pay | Admitting: Family Medicine

## 2018-08-06 DIAGNOSIS — K529 Noninfective gastroenteritis and colitis, unspecified: Secondary | ICD-10-CM

## 2018-08-11 ENCOUNTER — Ambulatory Visit: Payer: Federal, State, Local not specified - PPO

## 2018-08-12 ENCOUNTER — Encounter: Payer: Self-pay | Admitting: Family Medicine

## 2018-08-12 ENCOUNTER — Ambulatory Visit: Payer: Self-pay | Admitting: *Deleted

## 2018-08-12 ENCOUNTER — Ambulatory Visit: Payer: Federal, State, Local not specified - PPO | Admitting: Family Medicine

## 2018-08-12 VITALS — BP 152/94 | HR 75 | Temp 98.4°F | Ht 64.0 in | Wt 198.0 lb

## 2018-08-12 DIAGNOSIS — I10 Essential (primary) hypertension: Secondary | ICD-10-CM | POA: Diagnosis not present

## 2018-08-12 MED ORDER — CHLORTHALIDONE 25 MG PO TABS: 12.5000 mg | ORAL_TABLET | Freq: Every day | ORAL | 1 refills | Status: DC

## 2018-08-12 NOTE — Telephone Encounter (Signed)
Pt called with having an elevated b/p and headache this morning. Denies chest pain, blurred vision, shortness of breath, or weakness or dizziness. She takes her b/p medication at night so the last time was last night.  Her b/p this morning 171/105 and 188/117 HR 65. Appointment scheduled per protocol. Routing to flow at [facility]. Advised that if she starts having increase in symptoms, shortness of breath, dizziness, blurred vision and chest pain, she would need to go to the ED. Pt voiced understanding.  Reason for Disposition . [3] Systolic BP  >= 838 OR Diastolic >= 184  AND [0] having NO cardiac or neurologic symptoms  Answer Assessment - Initial Assessment Questions 1. BLOOD PRESSURE: "What is the blood pressure?" "Did you take at least two measurements 5 minutes apart?"     171/105 and 188/117 HR 65 2. ONSET: "When did you take your blood pressure?"     This morning 3. HOW: "How did you obtain the blood pressure?" (e.g., visiting nurse, automatic home BP monitor)     Automatic home BP monitor 4. HISTORY: "Do you have a history of high blood pressure?"     yes 5. MEDICATIONS: "Are you taking any medications for blood pressure?" "Have you missed any doses recently?" Have not missed any doses 6. OTHER SYMPTOMS: "Do you have any symptoms?" (e.g., headache, chest pain, blurred vision, difficulty breathing, weakness)     Headache for the last 2 days, blurred vision yesterday not today 7. PREGNANCY: "Is there any chance you are pregnant?" "When was your last menstrual period?"     Not pregnant, no periods  Protocols used: HIGH BLOOD PRESSURE-A-AH
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
## 2018-08-12 NOTE — Patient Instructions (Addendum)
Keep the diet clean and stay active.  Keep checking your blood pressure at home.  Let me know if your head/BP does not get better.   Let us know if you need anything.

## 2018-08-14 ENCOUNTER — Ambulatory Visit (INDEPENDENT_AMBULATORY_CARE_PROVIDER_SITE_OTHER): Payer: Federal, State, Local not specified - PPO | Admitting: *Deleted

## 2018-08-14 DIAGNOSIS — E538 Deficiency of other specified B group vitamins: Secondary | ICD-10-CM

## 2018-08-14 MED ORDER — CYANOCOBALAMIN 1000 MCG/ML IJ SOLN
1000.0000 ug | Freq: Once | INTRAMUSCULAR | Status: AC
  Administered 2018-08-14: 1000 ug via INTRAMUSCULAR

## 2018-08-14 NOTE — Progress Notes (Signed)
Pt here for monthly B12 injection per [place]  B12 1031mcg given IM in left deltoid, and pt tolerated injection well.  Next B12 injection scheduled for next month 09/11/18,

## 2018-08-19 ENCOUNTER — Ambulatory Visit: Payer: Federal, State, Local not specified - PPO | Admitting: Family Medicine

## 2018-08-19 ENCOUNTER — Encounter: Payer: Self-pay | Admitting: Family Medicine

## 2018-08-19 VITALS — BP 120/72 | HR 90 | Temp 98.3°F | Ht 64.0 in | Wt 201.1 lb

## 2018-08-19 DIAGNOSIS — I10 Essential (primary) hypertension: Secondary | ICD-10-CM
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
## 2018-08-19 NOTE — Patient Instructions (Signed)
Continue on your medications.  Give [place] 2-3 business days to get the results of your labs back.   Let us know if you need anything.

## 2018-08-20 LAB — BASIC METABOLIC PANEL
BUN: 9 mg/dL (ref 6–23)
CO2: 31 mEq/L (ref 19–32)
Calcium: 9.3 mg/dL (ref 8.4–10.5)
Chloride: 102 mEq/L (ref 96–112)
Creatinine, Ser: 0.86 mg/dL (ref 0.40–1.20)
GFR: 83.07 mL/min (ref >60.00)
Glucose, Bld: 82 mg/dL (ref 70–99)
Potassium: 3.9 mEq/L (ref 3.5–5.1)
Sodium: 140 mEq/L (ref 135–145)

## 2018-09-11 ENCOUNTER — Ambulatory Visit (INDEPENDENT_AMBULATORY_CARE_PROVIDER_SITE_OTHER): Payer: Federal, State, Local not specified - PPO

## 2018-09-11 ENCOUNTER — Other Ambulatory Visit: Payer: Self-pay

## 2018-09-11 ENCOUNTER — Ambulatory Visit: Payer: Federal, State, Local not specified - PPO

## 2018-09-11 ENCOUNTER — Other Ambulatory Visit: Payer: Self-pay | Admitting: Family Medicine

## 2018-09-11 DIAGNOSIS — E538 Deficiency of other specified B group vitamins: Secondary | ICD-10-CM

## 2018-09-11 MED ORDER — LIDOCAINE-HYDROCORTISONE ACE 3-0.5 % EX CREA: TOPICAL_CREAM | CUTANEOUS | 1 refills | Status: DC

## 2018-09-11 MED ORDER — CYANOCOBALAMIN 1000 MCG/ML IJ SOLN
1000.0000 ug | Freq: Once | INTRAMUSCULAR | Status: AC
  Administered 2018-09-11: 1000 ug via INTRAMUSCULAR

## 2018-09-11 NOTE — Progress Notes (Signed)
Pre visit review using our clinic tool,if applicable. No additional management support is needed unless otherwise documented below in the visit note.   Pt here for monthly B12 injection per order from Dr.N. Hannah Conley.  B12 1057mcg given IM, and pt tolerated injection well. No complaints voiced this visit.  Next B12 injection scheduled for 1 month. Appointment given to patient.

## 2018-09-11 NOTE — Progress Notes (Signed)
Noted. Agree with above.  [place], DO 09/11/18 9:29 AM

## 2018-09-18 ENCOUNTER — Ambulatory Visit (INDEPENDENT_AMBULATORY_CARE_PROVIDER_SITE_OTHER): Payer: Federal, State, Local not specified - PPO | Admitting: Family Medicine

## 2018-09-18 ENCOUNTER — Encounter: Payer: Self-pay | Admitting: Family Medicine

## 2018-09-18 ENCOUNTER — Other Ambulatory Visit: Payer: Self-pay

## 2018-09-18 DIAGNOSIS — M791 Myalgia, unspecified site: Secondary | ICD-10-CM

## 2018-09-18 DIAGNOSIS — R6883 Chills (without fever): Secondary | ICD-10-CM

## 2018-09-18 DIAGNOSIS — R05 Cough: Secondary | ICD-10-CM

## 2018-09-18 DIAGNOSIS — R6889 Other general symptoms and signs: Secondary | ICD-10-CM

## 2018-09-18 MED ORDER — PROMETHAZINE-DM 6.25-15 MG/5ML PO SYRP: 5.0000 mL | ORAL_SOLUTION | Freq: Four times a day (QID) | ORAL | 0 refills | Status: DC | PRN

## 2018-09-18 MED ORDER — PREDNISONE 20 MG PO TABS: 40.0000 mg | ORAL_TABLET | Freq: Every day | ORAL | 0 refills | Status: AC

## 2018-09-18 MED ORDER — OSELTAMIVIR PHOSPHATE 75 MG PO CAPS: 75.0000 mg | ORAL_CAPSULE | Freq: Two times a day (BID) | ORAL | 0 refills | Status: AC

## 2018-09-18 NOTE — Progress Notes (Signed)
Virtual Visit via Video Note  I connected with Hannah Conley on 09/18/18 at  3:00 PM EDT by a video enabled telemedicine application and verified that I am speaking with the correct person using two identifiers.   I discussed the limitations of evaluation and management by telemedicine and the availability of in person appointments. The patient expressed understanding and agreed to proceed.  History of Present Illness: 1 d of chills, cough, myalgias, stuffy nose, HA, CP from coughing. Denies runny nose, ear pain/drainage, itchy/watery eyes, wheezing, ST. Tylenol not helpful. Sick contact at work dx'd w bronchitis. No recent travel.    Observations/Objective: No conversational dyspnea Age appropriate judgment and insight Nml affect and mood  Assessment and Plan: Flu-like symptoms - Plan: predniSONE (DELTASONE) 20 MG tablet, oseltamivir (TAMIFLU) 75 MG capsule, promethazine-dextromethorphan (PROMETHAZINE-DM) 6.25-15 MG/5ML syrup  Orders as above. Supportive care.  Follow Up Instructions: PRN.    I discussed the assessment and treatment plan with the patient. The patient was provided an opportunity to ask questions and all were answered. The patient agreed with the plan and demonstrated an understanding of the instructions.   The patient was advised to call back or seek an in-person evaluation if the symptoms worsen or if the condition fails to improve as anticipated.   [place], DO

## 2018-09-20 DIAGNOSIS — H6501 Acute serous otitis media, right ear: Secondary | ICD-10-CM | POA: Diagnosis not present

## 2018-09-20 DIAGNOSIS — J329 Chronic sinusitis, unspecified: Secondary | ICD-10-CM | POA: Diagnosis not present

## 2018-09-20 DIAGNOSIS — H66002 Acute suppurative otitis media without spontaneous rupture of ear drum, left ear: Secondary | ICD-10-CM | POA: Diagnosis not present

## 2018-09-20 DIAGNOSIS — J029 Acute pharyngitis, unspecified: Secondary | ICD-10-CM | POA: Diagnosis not present

## 2018-09-22 ENCOUNTER — Other Ambulatory Visit: Payer: Self-pay

## 2018-09-22 ENCOUNTER — Telehealth: Payer: Self-pay | Admitting: Family Medicine

## 2018-09-22 ENCOUNTER — Encounter: Payer: Self-pay | Admitting: Family Medicine

## 2018-09-22 ENCOUNTER — Ambulatory Visit (INDEPENDENT_AMBULATORY_CARE_PROVIDER_SITE_OTHER): Payer: Federal, State, Local not specified - PPO | Admitting: Family Medicine

## 2018-09-22 VITALS — Temp 99.9°F

## 2018-09-22 DIAGNOSIS — R05 Cough: Secondary | ICD-10-CM

## 2018-09-22 DIAGNOSIS — R058 Other specified cough: Secondary | ICD-10-CM

## 2018-09-22 MED ORDER — HYDROCODONE-HOMATROPINE 5-1.5 MG/5ML PO SYRP: 5.0000 mL | ORAL_SOLUTION | Freq: Three times a day (TID) | ORAL | 0 refills | Status: DC | PRN

## 2018-09-22 NOTE — Progress Notes (Signed)
Virtual Visit via Video Note  I connected with Hannah Conley on 09/22/18 at 11:30 AM EDT by a video enabled telemedicine application and verified that I am speaking with the correct person using two identifiers.   I discussed the limitations of evaluation and management by telemedicine and the availability of in person appointments. The patient expressed understanding and agreed to proceed.  History of Present Illness: Evaluated for cough, [facility]'d [facility] AOM and sinusitis. Associated symptoms: cough, headache, chest pain from coughing Denies: sinus congestion, sinus pain, rhinorrhea, itchy watery eyes, ear pain, ear drainage, sore throat, wheezing, shortness of breath, myalgia and fevers Treatment to date: Abx, Tamiflu, prednisone   Observations/Objective: No conversational dyspnea Age appropriate judgment and insight Nml affect and mood +diaphoretic  Assessment and Plan: Post-viral cough syndrome - Plan: HYDROcodone-homatropine (HYCODAN) 5-1.5 MG/5ML syrup  Supportive care. Tylenol/ibuprofen for headache. Seek care if sob, increasing fevers, worsening cough.   Follow Up Instructions: PRN   I discussed the assessment and treatment plan with the patient. The patient was provided an opportunity to ask questions and all were answered. The patient agreed with the plan and demonstrated an understanding of the instructions.   The patient was advised to call back or seek an in-person evaluation if the symptoms worsen or if the condition fails to improve as anticipated.   [place], DO

## 2018-09-22 NOTE — Telephone Encounter (Signed)
Copied from [place] [id number]. Topic: Appointment Scheduling - Scheduling Inquiry for Clinic >> Sep 22, 2018  8:48 AM Margot Ables wrote: Reason for CRM: pt having fever now around 99.9, it started Sunday and continues thru today, pt has continued cough, denies SOB. Pt states she is tired of being sick. She states medications from 09/18/2018 has offered no improvement. Pt states that the constant cough is causing headaches as well.  She is asking for advice on how to get better.

## 2018-09-22 NOTE — Telephone Encounter (Signed)
SCHEDULE VIRTUAL VISIT FOR TODAY.

## 2018-10-09 ENCOUNTER — Other Ambulatory Visit: Payer: Self-pay

## 2018-10-09 ENCOUNTER — Ambulatory Visit (INDEPENDENT_AMBULATORY_CARE_PROVIDER_SITE_OTHER): Payer: Federal, State, Local not specified - PPO

## 2018-10-09 DIAGNOSIS — E538 Deficiency of other specified B group vitamins: Secondary | ICD-10-CM | POA: Diagnosis not present

## 2018-10-09 MED ORDER — CYANOCOBALAMIN 1000 MCG/ML IJ SOLN
1000.0000 ug | Freq: Once | INTRAMUSCULAR | Status: AC
  Administered 2018-10-09: 1000 ug via INTRAMUSCULAR

## 2018-10-09 NOTE — Progress Notes (Signed)
Pre visit review using our clinic tool,if applicable. No additional management support is needed unless otherwise documented below in the visit note.   Pt here for monthly B12 injection per order from Dr. Kathlene November.   B12 1046mcg given IM left deltoid, and pt tolerated injection well.  Next B12 injection scheduled for 1 month.

## 2018-10-16 NOTE — Progress Notes (Signed)
Noted. Agree with above.  [place], DO 10/16/18 8:21 AM

## 2018-10-21 ENCOUNTER — Telehealth: Payer: Self-pay | Admitting: Family Medicine

## 2018-10-21 NOTE — Telephone Encounter (Signed)
I will be there that day, unless there is a resurgence of COVID/extended quarantine, that should be fine. Ty.

## 2018-10-21 NOTE — Telephone Encounter (Signed)
Patient informed. 

## 2018-10-21 NOTE — Telephone Encounter (Signed)
Copied from [place] [id number]. Topic: Appointment Scheduling - Scheduling Inquiry for Clinic >> Oct 20, 2018  1:57 PM Lennox Solders wrote: Reason for CRM: pt is schedule for an appt with dr Nani Ravens on 11-18-2018. Pt does not want another virtual appt. Pt would like to know if 11-18-2018 is appt in office with md

## 2018-11-13 ENCOUNTER — Ambulatory Visit (HOSPITAL_BASED_OUTPATIENT_CLINIC_OR_DEPARTMENT_OTHER)
Admission: RE | Admit: 2018-11-13 | Discharge: 2018-11-13 | Disposition: A | Discharge status: Home / Self Care | Payer: Federal, State, Local not specified - PPO | Source: Ambulatory Visit | Attending: Internal Medicine | Admitting: Internal Medicine

## 2018-11-13 ENCOUNTER — Other Ambulatory Visit: Payer: Self-pay

## 2018-11-13 ENCOUNTER — Ambulatory Visit (INDEPENDENT_AMBULATORY_CARE_PROVIDER_SITE_OTHER): Payer: Federal, State, Local not specified - PPO

## 2018-11-13 ENCOUNTER — Telehealth: Payer: Self-pay | Admitting: Family Medicine

## 2018-11-13 ENCOUNTER — Encounter: Payer: Self-pay | Admitting: Internal Medicine

## 2018-11-13 ENCOUNTER — Ambulatory Visit: Payer: Federal, State, Local not specified - PPO | Admitting: Internal Medicine

## 2018-11-13 VITALS — BP 162/100 | HR 74 | Temp 98.1°F | Resp 16 | Ht 64.0 in | Wt 197.2 lb

## 2018-11-13 DIAGNOSIS — I10 Essential (primary) hypertension: Secondary | ICD-10-CM | POA: Diagnosis not present

## 2018-11-13 DIAGNOSIS — E538 Deficiency of other specified B group vitamins: Secondary | ICD-10-CM | POA: Diagnosis not present

## 2018-11-13 DIAGNOSIS — R079 Chest pain, unspecified: Secondary | ICD-10-CM

## 2018-11-13 MED ORDER — CYANOCOBALAMIN 1000 MCG/ML IJ SOLN
1000.0000 ug | Freq: Once | INTRAMUSCULAR | Status: AC
  Administered 2018-11-13: 1000 ug via INTRAMUSCULAR

## 2018-11-13 MED ORDER — AMLODIPINE BESYLATE 5 MG PO TABS: 5.0000 mg | ORAL_TABLET | Freq: Every day | ORAL | 0 refills | Status: DC
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
## 2018-11-13 NOTE — Patient Instructions (Addendum)
   GO TO THE FRONT DESK Schedule your next appointment get an appointment to see your primary doctor in 2 weeks  STOP BY THE FIRST FLOOR:  get the XR   Continue the same medications  Add amlodipine 5 mg for better control of your blood pressure  Check the  blood pressure   daily Ideal  between 110/65 and  135/85. If it is consistently higher or lower, let me know  Tylenol  500 mg OTC 2 tabs a day every 8 hours as needed for pain  IBUPROFEN (Advil or Motrin) 200 mg 1 or 2 tablets every 12 hours as needed for pain.  Always take it with food because may cause gastritis and ulcers.  If you notice nausea, stomach pain, change in the color of stools --->  Stop the medicine and let us know   If your chest pain is not gradually getting better let us know  If he has severe chest pain, difficulty breathing, a different type of pain: Go to the ER

## 2018-11-13 NOTE — Progress Notes (Signed)
Pre visit review using our clinic review tool, if applicable. No additional management support is needed unless otherwise documented below in the visit note. 

## 2018-11-13 NOTE — Progress Notes (Addendum)
Pt here for monthly B12 injection per Dr. Nani Ravens   B12 1078mcg given IM right deltoid, and pt tolerated injection well.  Patient also complains of chest pain for 6 days. She is worried if something happens to her at work her coworkers would not know what to do. She states its horrible pain with laying down and sitting. I put her on the schedule to see Dr. Larose Kells DOD.   Kathlene November, MD

## 2018-11-13 NOTE — Telephone Encounter (Signed)
Return in about 2 weeks (around 11/27/2018) for routine visit , 20 minutes. Left msg for patient to call back

## 2018-11-17 ENCOUNTER — Other Ambulatory Visit: Payer: Self-pay | Admitting: Family Medicine

## 2018-11-18 ENCOUNTER — Ambulatory Visit: Payer: Federal, State, Local not specified - PPO | Admitting: Family Medicine

## 2018-11-27 ENCOUNTER — Ambulatory Visit: Payer: Federal, State, Local not specified - PPO | Admitting: Family Medicine

## 2018-11-27 ENCOUNTER — Other Ambulatory Visit: Payer: Self-pay

## 2018-11-27 ENCOUNTER — Encounter: Payer: Self-pay | Admitting: Family Medicine

## 2018-11-27 VITALS — BP 159/100 | HR 70 | Temp 98.3°F | Resp 18 | Ht 64.0 in | Wt 196.8 lb

## 2018-11-27 DIAGNOSIS — R0789 Other chest pain: Secondary | ICD-10-CM | POA: Diagnosis not present

## 2018-11-27 DIAGNOSIS — I10 Essential (primary) hypertension: Secondary | ICD-10-CM | POA: Diagnosis not present

## 2018-11-27 MED ORDER — AMLODIPINE BESYLATE-VALSARTAN 5-320 MG PO TABS: 1.0000 | ORAL_TABLET | Freq: Every day | ORAL | 1 refills | Status: DC

## 2018-11-27 MED ORDER — SPIRONOLACTONE 25 MG PO TABS: 25.0000 mg | ORAL_TABLET | Freq: Every day | ORAL | 3 refills | Status: DC
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
## 2018-11-27 NOTE — Patient Instructions (Addendum)
Continue checking blood pressures at home.  Keep the diet clean and stay active.   OK to take Tylenol 1000 mg (2 extra strength tabs) or 975 mg (3 regular strength tabs) every 6 hours as needed.  Pectoralis Major Rehab Ask your health care provider which exercises are safe for you. Do exercises exactly as told by your health care provider and adjust them as directed. It is normal to feel mild stretching, pulling, tightness, or discomfort as you do these exercises, but you should stop right away if you feel sudden pain or your pain gets worse.Do not begin these exercises until told by your health care provider. Stretching and range of motion exercises These exercises warm up your muscles and joints and improve the movement and flexibility of your shoulder. These exercises can also help to relieve pain, numbness, and tingling. Exercise A: Pendulum  1. Stand near a wall or a surface that you can hold onto for balance. 2. Bend at the waist and let your left / right arm hang straight down. Use your other arm to keep your balance. 3. Relax your arm and shoulder muscles, and move your hips and your trunk so your left / right arm swings freely. Your arm should swing because of the motion of your body, not because you are using your arm or shoulder muscles. 4. Keep moving so your arm swings in the following directions, as told by your health care provider: ? Side to side. ? Forward and backward. ? In clockwise and counterclockwise circles. 5. Slowly return to the starting position. Repeat 2 times. Complete this exercise 3 times per week. Exercise B: Abduction, standing 1. Stand and hold a broomstick, a cane, or a similar object. Place your hands a little more than shoulder-width apart on the object. Your left / right hand should be palm-up, and your other hand should be palm-down. 2. While keeping your elbow straight and your shoulder muscles relaxed, push the stick across your body toward your left /  right side. Raise your left / right arm to the side of your body and then over your head until you feel a stretch in your shoulder. ? Stop when you reach the angle that is recommended by your health care provider. ? Avoid shrugging your shoulder while you raise your arm. Keep your shoulder blade tucked down toward the middle of your spine. 3. Hold for 10 seconds. 4. Slowly return to the starting position. Repeat 2 times. Complete this exercise 3 times per week. Exercise C: Wand flexion, supine  1. Lie on your back. You may bend your knees for comfort. 2. Hold a broomstick, a cane, or a similar object so that your hands are about shoulder-width apart on the object. Your palms should face toward your feet. 3. Raise your left / right arm in front of your face, then behind your head (toward the floor). Use your other hand to help you do this. Stop when you feel a gentle stretch in your shoulder, or when you reach the angle that is recommended by your health care provider. 4. Hold for 3 seconds. 5. Use the broomstick and your other arm to help you return your left / right arm to the starting position. Repeat 2 times. Complete this exercise 3 times per week. Exercise D: Wand shoulder external rotation 1. Stand and hold a broomstick, a cane, or a similar object so your handsare about shoulder-width apart on the object. 2. Start with your arms hanging down, then bend  both elbows to an "L" shape (90 degrees). 3. Keep your left / right elbow at your side. Use your other hand to push the stick so your left / right forearm moves away from your body, out to your side. ? Keep your left / right elbow bent to 90 degrees and keep it against your side. ? Stop when you feel a gentle stretch in your shoulder, or when you reach the angle recommended by your health care provider. 4. Hold for 10 seconds. 5. Use the stick to help you return your left / right arm to the starting position. Repeat 2 times. Complete this  exercise 3 times per week. Strengthening exercises These exercises build strength and endurance in your shoulder. Endurance is the ability to use your muscles for a long time, even after your muscles get tired. Exercise E: Scapular protraction, standing 1. Stand so you are facing a wall. Place your feet about one arm-length away from the wall. 2. Place your hands on the wall and straighten your elbows. 3. Keep your hands on the wall as you push your upper back away from the wall. You should feel your shoulder blades sliding forward.Keep your elbows and your head still. ? If you are not sure that you are doing this exercise correctly, ask your health care provider for more instructions. 4. Hold for 3 seconds. 5. Slowly return to the starting position. Let your muscles relax completely before you repeat this exercise. Repeat 2 times. Complete this exercise 3 times per week. Exercise F: Shoulder blade squeezes  (scapular retraction) 1. Sit with good posture in a stable chair. Do not let your back touch the back of the chair. 2. Your arms should be at your sides with your elbows bent. You may rest your forearms on a pillow if that is more comfortable. 3. Squeeze your shoulder blades together. Bring them down and back. ? Keep your shoulders level. ? Do not lift your shoulders up toward your ears. 4. Hold for 3 seconds. 5. Return to the starting position. Repeat 2 times. Complete this exercise 3 times per week. This information is not intended to replace advice given to you by your health care provider. Make sure you discuss any questions you have with your health care provider. Document Released: 06/03/2005 Document Revised: 03/14/2016 Document Reviewed: 02/19/2015 Elsevier Interactive Patient Education  [employer].

## 2018-12-04 ENCOUNTER — Ambulatory Visit: Payer: Federal, State, Local not specified - PPO | Admitting: Family Medicine

## 2018-12-11 ENCOUNTER — Ambulatory Visit: Payer: Federal, State, Local not specified - PPO | Admitting: Family Medicine

## 2018-12-11 ENCOUNTER — Other Ambulatory Visit: Payer: Self-pay

## 2018-12-11 ENCOUNTER — Encounter: Payer: Self-pay | Admitting: Family Medicine

## 2018-12-11 VITALS — BP 128/86 | HR 79 | Temp 98.5°F | Ht 64.0 in | Wt 195.0 lb

## 2018-12-11 DIAGNOSIS — I10 Essential (primary) hypertension: Secondary | ICD-10-CM | POA: Diagnosis not present

## 2018-12-11 DIAGNOSIS — G44209 Tension-type headache, unspecified, not intractable: Secondary | ICD-10-CM

## 2018-12-11 MED ORDER — METHYLPREDNISOLONE ACETATE 80 MG/ML IJ SUSP
80.0000 mg | Freq: Once | INTRAMUSCULAR | Status: AC
  Administered 2018-12-11: 80 mg via INTRAMUSCULAR

## 2018-12-11 NOTE — Patient Instructions (Signed)
No changes for now. Continue checking your blood pressures at home. Bring your monitor to your nurse visit.  Keep the diet clean and stay active.  Let us know if you need anything.
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
## 2018-12-15 ENCOUNTER — Ambulatory Visit (INDEPENDENT_AMBULATORY_CARE_PROVIDER_SITE_OTHER): Payer: Federal, State, Local not specified - PPO | Admitting: Family Medicine

## 2018-12-15 ENCOUNTER — Other Ambulatory Visit: Payer: Self-pay

## 2018-12-15 DIAGNOSIS — I10 Essential (primary) hypertension: Secondary | ICD-10-CM | POA: Diagnosis not present

## 2018-12-15 MED ORDER — METOPROLOL SUCCINATE ER 50 MG PO TB24: 50.0000 mg | ORAL_TABLET | Freq: Every day | ORAL | 1 refills | Status: DC

## 2018-12-15 NOTE — Patient Instructions (Signed)
Continue same medications- add metoprolol XL 50mg  1 tablet daily. Follow-up with Dr. Nani Ravens in 1-2 weeks. Please bring your home BP monitor.

## 2018-12-15 NOTE — Progress Notes (Signed)
Pt here today for BP check.   Pt currently taking Exforge 5-320mg  daily, spironolactone 25mg  daily, and chlorthalidone 25mg  daily.   BP today in L arm: 143/92 Pulse: 70  Pt also brought her own BP monitor from home:  BP L arm: 144/99 Pulse: 71   Per Dr. Nani Ravens: Continue same meds- add metoprolol xl 50mg  once daily. Follow-up in 1-2 weeks.

## 2018-12-15 NOTE — Progress Notes (Signed)
Noted. Agree with above.  [place], DO 12/15/18 3:31 PM

## 2018-12-17 ENCOUNTER — Other Ambulatory Visit: Payer: Self-pay

## 2018-12-17 ENCOUNTER — Ambulatory Visit (INDEPENDENT_AMBULATORY_CARE_PROVIDER_SITE_OTHER): Payer: Federal, State, Local not specified - PPO

## 2018-12-17 DIAGNOSIS — E538 Deficiency of other specified B group vitamins: Secondary | ICD-10-CM | POA: Diagnosis not present

## 2018-12-17 MED ORDER — CYANOCOBALAMIN 1000 MCG/ML IJ SOLN
1000.0000 ug | Freq: Once | INTRAMUSCULAR | Status: AC
  Administered 2018-12-17: 15:00:00 1000 ug via INTRAMUSCULAR

## 2018-12-17 NOTE — Progress Notes (Signed)
Pt here for monthly B12 injection per Dr. Nani Ravens  B12 1030mcg given IM in left deloid, and pt tolerated injection well.  Next B12 injection scheduled for next month.

## 2018-12-20 NOTE — Progress Notes (Signed)
Noted. Agree with above.  [place], DO 12/20/18 1:57 PM

## 2018-12-29 ENCOUNTER — Ambulatory Visit: Payer: Federal, State, Local not specified - PPO | Admitting: Family Medicine

## 2018-12-29 DIAGNOSIS — Z0289 Encounter for other administrative examinations: Secondary | ICD-10-CM

## 2019-01-14 ENCOUNTER — Ambulatory Visit: Payer: Federal, State, Local not specified - PPO

## 2019-01-20 ENCOUNTER — Ambulatory Visit: Payer: Federal, State, Local not specified - PPO

## 2019-01-20 ENCOUNTER — Ambulatory Visit (INDEPENDENT_AMBULATORY_CARE_PROVIDER_SITE_OTHER): Payer: Federal, State, Local not specified - PPO

## 2019-01-20 ENCOUNTER — Other Ambulatory Visit: Payer: Self-pay

## 2019-01-20 DIAGNOSIS — E538 Deficiency of other specified B group vitamins: Secondary | ICD-10-CM | POA: Diagnosis not present

## 2019-01-20 MED ORDER — CYANOCOBALAMIN 1000 MCG/ML IJ SOLN
1000.0000 ug | Freq: Once | INTRAMUSCULAR | Status: AC
  Administered 2019-01-20: 11:00:00 1000 ug via INTRAMUSCULAR

## 2019-01-20 NOTE — Progress Notes (Signed)
Pt here for monthly B12 injection per Dr. Nani Ravens  B12 1057mcg given IM in Right deloid, and pt tolerated injection well.  Next B12 injection scheduled for next month(02/26/19).

## 2019-01-26 NOTE — Progress Notes (Signed)
Noted. Agree with above.  [place], DO 01/26/19 3:26 PM

## 2019-02-23 ENCOUNTER — Other Ambulatory Visit: Payer: Self-pay

## 2019-02-23 ENCOUNTER — Encounter: Payer: Self-pay | Admitting: Family Medicine

## 2019-02-23 ENCOUNTER — Ambulatory Visit: Payer: Federal, State, Local not specified - PPO | Admitting: Family Medicine

## 2019-02-23 VITALS — BP 130/82 | HR 74 | Temp 97.1°F | Ht 64.0 in | Wt 188.2 lb

## 2019-02-23 DIAGNOSIS — M940 Chondrocostal junction syndrome [Tietze]: Secondary | ICD-10-CM

## 2019-02-23 DIAGNOSIS — N3000 Acute cystitis without hematuria: Secondary | ICD-10-CM | POA: Diagnosis not present

## 2019-02-23 LAB — POCT URINALYSIS DIPSTICK
Bilirubin, UA: NEGATIVE
Blood, UA: NEGATIVE
Glucose, UA: NEGATIVE
Ketones, UA: NEGATIVE
Nitrite, UA: NEGATIVE
Protein, UA: NEGATIVE
Spec Grav, UA: 1.01 (ref 1.010–1.025)
Urobilinogen, UA: 0.2 E.U./dL
pH, UA: 7 (ref 5.0–8.0)

## 2019-02-23 MED ORDER — SULFAMETHOXAZOLE-TRIMETHOPRIM 800-160 MG PO TABS: 1.0000 | ORAL_TABLET | Freq: Two times a day (BID) | ORAL | 0 refills | Status: DC

## 2019-02-23 MED ORDER — URIBEL 118 MG PO CAPS: ORAL_CAPSULE | ORAL | 1 refills | Status: DC
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
## 2019-02-23 NOTE — Patient Instructions (Addendum)
Stay hydrated.   Warning signs/symptoms: Uncontrollable nausea/vomiting, fevers, worsening symptoms despite treatment, confusion.  Give [place] around 2 business days to get culture back to you.  If you do not hear anything about your referral in the next 1-2 weeks, call our office and ask for an update.  Continue stretch your pectorals, Tylenol.   Great work with your weight loss!  Let us know if you need anything.

## 2019-02-25 LAB — URINE CULTURE
MICRO NUMBER:: 855407
Result:: NO GROWTH
SPECIMEN QUALITY:: ADEQUATE

## 2019-02-26 ENCOUNTER — Ambulatory Visit: Payer: Federal, State, Local not specified - PPO

## 2019-03-03 ENCOUNTER — Ambulatory Visit: Payer: Federal, State, Local not specified - PPO

## 2019-03-04 DIAGNOSIS — Z1231 Encounter for screening mammogram for malignant neoplasm of breast: Secondary | ICD-10-CM | POA: Diagnosis not present

## 2019-03-04 DIAGNOSIS — Z01419 Encounter for gynecological examination (general) (routine) without abnormal findings: Secondary | ICD-10-CM | POA: Diagnosis not present

## 2019-03-04 DIAGNOSIS — Z6832 Body mass index (BMI) 32.0-32.9, adult: Secondary | ICD-10-CM | POA: Diagnosis not present

## 2019-03-10 ENCOUNTER — Ambulatory Visit (INDEPENDENT_AMBULATORY_CARE_PROVIDER_SITE_OTHER): Payer: Federal, State, Local not specified - PPO | Admitting: *Deleted

## 2019-03-10 ENCOUNTER — Other Ambulatory Visit: Payer: Self-pay

## 2019-03-10 DIAGNOSIS — E538 Deficiency of other specified B group vitamins: Secondary | ICD-10-CM | POA: Diagnosis not present

## 2019-03-10 MED ORDER — CYANOCOBALAMIN 1000 MCG/ML IJ SOLN
1000.0000 ug | Freq: Once | INTRAMUSCULAR | Status: AC
  Administered 2019-03-10: 10:00:00 1000 ug via INTRAMUSCULAR

## 2019-03-10 NOTE — Progress Notes (Signed)
Pt here for monthly B12 injection perDr. Nani Ravens  B12 1022mcg given IMin left deloid, and pt tolerated injection well.  Next B12 injection scheduled fornext month 04/09/19.

## 2019-03-19 ENCOUNTER — Other Ambulatory Visit: Payer: Self-pay | Admitting: Family Medicine

## 2019-03-24 ENCOUNTER — Ambulatory Visit: Payer: Federal, State, Local not specified - PPO | Admitting: Family Medicine
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
## 2019-04-02 ENCOUNTER — Other Ambulatory Visit: Payer: Self-pay | Admitting: Family Medicine

## 2019-04-09 ENCOUNTER — Other Ambulatory Visit: Payer: Self-pay

## 2019-04-09 ENCOUNTER — Ambulatory Visit (INDEPENDENT_AMBULATORY_CARE_PROVIDER_SITE_OTHER): Payer: Federal, State, Local not specified - PPO | Admitting: *Deleted

## 2019-04-09 DIAGNOSIS — E538 Deficiency of other specified B group vitamins: Secondary | ICD-10-CM

## 2019-04-09 MED ORDER — CYANOCOBALAMIN 1000 MCG/ML IJ SOLN
1000.0000 ug | Freq: Once | INTRAMUSCULAR | Status: AC
  Administered 2019-04-09: 1000 ug via INTRAMUSCULAR

## 2019-04-09 NOTE — Progress Notes (Signed)
Pt here for monthly B12 injection perDr. Nani Ravens  B12 1043mcg given IMin rightdeloid, and pt tolerated injection well.  Next B12 injection scheduled fornext month 04/09/19.

## 2019-05-07 ENCOUNTER — Ambulatory Visit: Payer: Federal, State, Local not specified - PPO

## 2019-05-11 ENCOUNTER — Other Ambulatory Visit: Payer: Self-pay

## 2019-05-11 ENCOUNTER — Ambulatory Visit (INDEPENDENT_AMBULATORY_CARE_PROVIDER_SITE_OTHER): Payer: Federal, State, Local not specified - PPO | Admitting: *Deleted

## 2019-05-11 DIAGNOSIS — E538 Deficiency of other specified B group vitamins: Secondary | ICD-10-CM

## 2019-05-11 MED ORDER — CYANOCOBALAMIN 1000 MCG/ML IJ SOLN
1000.0000 ug | Freq: Once | INTRAMUSCULAR | Status: AC
  Administered 2019-05-11: 1000 ug via INTRAMUSCULAR

## 2019-05-11 NOTE — Progress Notes (Signed)
Pt here for monthly B12 injection perDr. Nani Ravens  B12 1065mcg given IMinrightdeloid, and pt tolerated injection well.  Next B12 injection scheduled fornext month12/29/20.

## 2019-05-24 ENCOUNTER — Ambulatory Visit (INDEPENDENT_AMBULATORY_CARE_PROVIDER_SITE_OTHER): Payer: Federal, State, Local not specified - PPO | Admitting: Family Medicine

## 2019-05-24 ENCOUNTER — Encounter: Payer: Self-pay | Admitting: Family Medicine

## 2019-05-24 ENCOUNTER — Other Ambulatory Visit: Payer: Self-pay

## 2019-05-24 DIAGNOSIS — R040 Epistaxis: Secondary | ICD-10-CM

## 2019-05-24 DIAGNOSIS — R519 Headache, unspecified: Secondary | ICD-10-CM | POA: Diagnosis not present

## 2019-05-24 MED ORDER — KETOROLAC TROMETHAMINE 60 MG/2ML IM SOLN
60.0000 mg | Freq: Once | INTRAMUSCULAR | Status: AC
  Administered 2019-05-24: 60 mg via INTRAMUSCULAR
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
## 2019-06-07 ENCOUNTER — Other Ambulatory Visit: Payer: Self-pay | Admitting: Family Medicine

## 2019-06-15 ENCOUNTER — Other Ambulatory Visit: Payer: Self-pay

## 2019-06-15 ENCOUNTER — Ambulatory Visit (INDEPENDENT_AMBULATORY_CARE_PROVIDER_SITE_OTHER): Payer: Federal, State, Local not specified - PPO

## 2019-06-15 DIAGNOSIS — E538 Deficiency of other specified B group vitamins: Secondary | ICD-10-CM

## 2019-06-15 MED ORDER — CYANOCOBALAMIN 1000 MCG/ML IJ SOLN
1000.0000 ug | Freq: Once | INTRAMUSCULAR | Status: AC
  Administered 2019-06-15: 16:00:00 1000 ug via INTRAMUSCULAR

## 2019-06-15 NOTE — Progress Notes (Signed)
Pt here for monthly B12 injection per Dr. Nani Ravens.   B12 102mcg given IM, and pt tolerated injection well.  Next B12 injection scheduled for 07/16/2019.   Gerilyn Nestle

## 2019-06-15 NOTE — Progress Notes (Signed)
Noted. Agree with above.  [place], DO 06/15/19 4:40 PM

## 2019-07-08 ENCOUNTER — Other Ambulatory Visit: Payer: Self-pay

## 2019-07-09 ENCOUNTER — Ambulatory Visit: Payer: Federal, State, Local not specified - PPO | Admitting: Family Medicine

## 2019-07-09 ENCOUNTER — Encounter: Payer: Self-pay | Admitting: Family Medicine

## 2019-07-09 VITALS — BP 142/98 | HR 75 | Ht 64.0 in | Wt 197.0 lb

## 2019-07-09 DIAGNOSIS — I10 Essential (primary) hypertension: Secondary | ICD-10-CM | POA: Diagnosis not present

## 2019-07-09 DIAGNOSIS — W19XXXA Unspecified fall, initial encounter: Secondary | ICD-10-CM | POA: Diagnosis not present

## 2019-07-09 DIAGNOSIS — L659 Nonscarring hair loss, unspecified: Secondary | ICD-10-CM

## 2019-07-09 NOTE — Patient Instructions (Signed)
Try taking iron (ferrous sulfate) 3 times daily to help with the hair. This should not increase your appetite.  Consider biotin (Vit B10) for your hair.  Keep the diet clean and stay active.  The bruise looks good. This should slowly be reabsorbed by your body over the next couple weeks.   Ice/cold pack over area for 10-15 min twice daily.  OK to take Tylenol 1000 mg (2 extra strength tabs) or 975 mg (3 regular strength tabs) every 6 hours as needed.  Let us know if you need anything.
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
## 2019-07-13 ENCOUNTER — Other Ambulatory Visit: Payer: Self-pay | Admitting: Family Medicine

## 2019-07-13 DIAGNOSIS — I10 Essential (primary) hypertension: Secondary | ICD-10-CM

## 2019-07-15 ENCOUNTER — Telehealth: Payer: Self-pay | Admitting: *Deleted

## 2019-07-15 NOTE — Telephone Encounter (Signed)
Left message on machine to call back for screening for appt tomorrow

## 2019-07-16 ENCOUNTER — Ambulatory Visit (INDEPENDENT_AMBULATORY_CARE_PROVIDER_SITE_OTHER): Payer: Federal, State, Local not specified - PPO

## 2019-07-16 ENCOUNTER — Other Ambulatory Visit: Payer: Self-pay

## 2019-07-16 DIAGNOSIS — E538 Deficiency of other specified B group vitamins: Secondary | ICD-10-CM | POA: Diagnosis not present

## 2019-07-16 MED ORDER — CYANOCOBALAMIN 1000 MCG/ML IJ SOLN
1000.0000 ug | Freq: Once | INTRAMUSCULAR | Status: AC
  Administered 2019-07-16: 1000 ug via INTRAMUSCULAR

## 2019-08-13 ENCOUNTER — Ambulatory Visit (INDEPENDENT_AMBULATORY_CARE_PROVIDER_SITE_OTHER): Payer: Federal, State, Local not specified - PPO

## 2019-08-13 ENCOUNTER — Other Ambulatory Visit: Payer: Self-pay

## 2019-08-13 DIAGNOSIS — E538 Deficiency of other specified B group vitamins: Secondary | ICD-10-CM

## 2019-08-13 MED ORDER — CYANOCOBALAMIN 1000 MCG/ML IJ SOLN
1000.0000 ug | Freq: Once | INTRAMUSCULAR | Status: AC
  Administered 2019-08-13: 1000 ug via INTRAMUSCULAR

## 2019-08-13 NOTE — Progress Notes (Addendum)
Pt here for monthly B12 injection per Dr.Wendling   B12 102mcg given in her right deltoid  IM, and pt tolerated injection well.  Next B12 injection scheduled for next month.  Nursing note reviewed. Agree with documention and plan.

## 2019-09-10 ENCOUNTER — Ambulatory Visit: Payer: Federal, State, Local not specified - PPO

## 2019-09-13 ENCOUNTER — Other Ambulatory Visit: Payer: Self-pay

## 2019-09-22 ENCOUNTER — Other Ambulatory Visit: Payer: Self-pay

## 2019-09-22 ENCOUNTER — Ambulatory Visit: Payer: Federal, State, Local not specified - PPO

## 2019-09-22 DIAGNOSIS — E538 Deficiency of other specified B group vitamins: Secondary | ICD-10-CM

## 2019-09-22 MED ORDER — CYANOCOBALAMIN 1000 MCG/ML IJ SOLN: 1000.0000 ug | Freq: Once | INTRAMUSCULAR | Status: DC

## 2019-09-22 NOTE — Progress Notes (Signed)
Pt here for monthly B12 injection per Dr. Nani Ravens  B12 1096mcg given IM  Left deltoid, and pt tolerated injection well.  Next B12 injection scheduled for 10-22-19.

## 2019-09-23 ENCOUNTER — Ambulatory Visit: Payer: Federal, State, Local not specified - PPO | Admitting: Family Medicine

## 2019-09-23 ENCOUNTER — Encounter: Payer: Self-pay | Admitting: Family Medicine

## 2019-09-23 ENCOUNTER — Other Ambulatory Visit: Payer: Self-pay

## 2019-09-23 VITALS — BP 118/78 | HR 75 | Temp 97.9°F | Resp 18 | Ht 64.0 in | Wt 196.4 lb

## 2019-09-23 DIAGNOSIS — R519 Headache, unspecified: Secondary | ICD-10-CM | POA: Diagnosis not present

## 2019-09-23 DIAGNOSIS — J029 Acute pharyngitis, unspecified: Secondary | ICD-10-CM

## 2019-09-23 MED ORDER — KETOROLAC TROMETHAMINE 60 MG/2ML IM SOLN
60.0000 mg | Freq: Once | INTRAMUSCULAR | Status: AC
  Administered 2019-09-23: 60 mg via INTRAMUSCULAR

## 2019-09-23 MED ORDER — IBUPROFEN 800 MG PO TABS: 800.0000 mg | ORAL_TABLET | Freq: Three times a day (TID) | ORAL | 0 refills | Status: DC

## 2019-09-23 NOTE — Patient Instructions (Signed)

## 2019-09-23 NOTE — Assessment & Plan Note (Signed)
toradol given May be from sinuses Pt advised to take otc antihistamine ie claritin  F/u pcp 2-3 weeks

## 2019-09-23 NOTE — Assessment & Plan Note (Signed)
?  pnd----   Antihistamine otc ,  flonase if needed F/u pcp virtually if symptoms worsen
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
## 2019-10-18 ENCOUNTER — Encounter: Payer: Self-pay | Admitting: Family Medicine

## 2019-10-18 ENCOUNTER — Ambulatory Visit: Payer: Federal, State, Local not specified - PPO | Admitting: Family Medicine

## 2019-10-18 ENCOUNTER — Other Ambulatory Visit: Payer: Self-pay

## 2019-10-18 VITALS — BP 134/82 | HR 75 | Temp 95.6°F | Ht 64.0 in | Wt 195.5 lb

## 2019-10-18 DIAGNOSIS — R519 Headache, unspecified: Secondary | ICD-10-CM | POA: Diagnosis not present

## 2019-10-18 DIAGNOSIS — M940 Chondrocostal junction syndrome [Tietze]: Secondary | ICD-10-CM | POA: Diagnosis not present

## 2019-10-18 MED ORDER — IBUPROFEN 800 MG PO TABS: 800.0000 mg | ORAL_TABLET | Freq: Three times a day (TID) | ORAL | 2 refills | Status: DC
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
## 2019-10-18 NOTE — Patient Instructions (Signed)
Ice/cold pack over area for 10-15 min twice daily.   If things get worse, send me a message.   Pectoralis Major Rehab Ask your health care provider which exercises are safe for you. Do exercises exactly as told by your health care provider and adjust them as directed. It is normal to feel mild stretching, pulling, tightness, or discomfort as you do these exercises, but you should stop right away if you feel sudden pain or your pain gets worse.Do not begin these exercises until told by your health care provider. Stretching and range of motion exercises These exercises warm up your muscles and joints and improve the movement and flexibility of your shoulder. These exercises can also help to relieve pain, numbness, and tingling. Exercise A: Pendulum  1. Stand near a wall or a surface that you can hold onto for balance. 2. Bend at the waist and let your left / right arm hang straight down. Use your other arm to keep your balance. 3. Relax your arm and shoulder muscles, and move your hips and your trunk so your left / right arm swings freely. Your arm should swing because of the motion of your body, not because you are using your arm or shoulder muscles. 4. Keep moving so your arm swings in the following directions, as told by your health care provider: ? Side to side. ? Forward and backward. ? In clockwise and counterclockwise circles. 5. Slowly return to the starting position. Repeat 2 times. Complete this exercise 3 times per week. Exercise B: Abduction, standing 1. Stand and hold a broomstick, a cane, or a similar object. Place your hands a little more than shoulder-width apart on the object. Your left / right hand should be palm-up, and your other hand should be palm-down. 2. While keeping your elbow straight and your shoulder muscles relaxed, push the stick across your body toward your left / right side. Raise your left / right arm to the side of your body and then over your head until you  feel a stretch in your shoulder. ? Stop when you reach the angle that is recommended by your health care provider. ? Avoid shrugging your shoulder while you raise your arm. Keep your shoulder blade tucked down toward the middle of your spine. 3. Hold for 10 seconds. 4. Slowly return to the starting position. Repeat 2 times. Complete this exercise 3 times per week. Exercise C: Wand flexion, supine  1. Lie on your back. You may bend your knees for comfort. 2. Hold a broomstick, a cane, or a similar object so that your hands are about shoulder-width apart on the object. Your palms should face toward your feet. 3. Raise your left / right arm in front of your face, then behind your head (toward the floor). Use your other hand to help you do this. Stop when you feel a gentle stretch in your shoulder, or when you reach the angle that is recommended by your health care provider. 4. Hold for 3 seconds. 5. Use the broomstick and your other arm to help you return your left / right arm to the starting position. Repeat 2 times. Complete this exercise 3 times per week. Exercise D: Wand shoulder external rotation 1. Stand and hold a broomstick, a cane, or a similar object so your handsare about shoulder-width apart on the object. 2. Start with your arms hanging down, then bend both elbows to an "L" shape (90 degrees). 3. Keep your left / right elbow at your side.  Use your other hand to push the stick so your left / right forearm moves away from your body, out to your side. ? Keep your left / right elbow bent to 90 degrees and keep it against your side. ? Stop when you feel a gentle stretch in your shoulder, or when you reach the angle recommended by your health care provider. 4. Hold for 10 seconds. 5. Use the stick to help you return your left / right arm to the starting position. Repeat 2 times. Complete this exercise 3 times per week. Strengthening exercises These exercises build strength and endurance  in your shoulder. Endurance is the ability to use your muscles for a long time, even after your muscles get tired. Exercise E: Scapular protraction, standing 1. Stand so you are facing a wall. Place your feet about one arm-length away from the wall. 2. Place your hands on the wall and straighten your elbows. 3. Keep your hands on the wall as you push your upper back away from the wall. You should feel your shoulder blades sliding forward.Keep your elbows and your head still. ? If you are not sure that you are doing this exercise correctly, ask your health care provider for more instructions. 4. Hold for 3 seconds. 5. Slowly return to the starting position. Let your muscles relax completely before you repeat this exercise. Repeat 2 times. Complete this exercise 3 times per week. Exercise F: Shoulder blade squeezes  (scapular retraction) 1. Sit with good posture in a stable chair. Do not let your back touch the back of the chair. 2. Your arms should be at your sides with your elbows bent. You may rest your forearms on a pillow if that is more comfortable. 3. Squeeze your shoulder blades together. Bring them down and back. ? Keep your shoulders level. ? Do not lift your shoulders up toward your ears. 4. Hold for 3 seconds. 5. Return to the starting position. Repeat 2 times. Complete this exercise 3 times per week. This information is not intended to replace advice given to you by your health care provider. Make sure you discuss any questions you have with your health care provider. Document Released: 06/03/2005 Document Revised: 03/14/2016 Document Reviewed: 02/19/2015 Elsevier Interactive Patient Education  [employer].

## 2019-10-22 ENCOUNTER — Other Ambulatory Visit: Payer: Self-pay

## 2019-10-22 ENCOUNTER — Ambulatory Visit (INDEPENDENT_AMBULATORY_CARE_PROVIDER_SITE_OTHER): Payer: Federal, State, Local not specified - PPO

## 2019-10-22 DIAGNOSIS — E538 Deficiency of other specified B group vitamins: Secondary | ICD-10-CM | POA: Diagnosis not present

## 2019-10-22 MED ORDER — CYANOCOBALAMIN 1000 MCG/ML IJ SOLN
1000.0000 ug | Freq: Once | INTRAMUSCULAR | Status: AC
  Administered 2019-10-22: 1000 ug via INTRAMUSCULAR

## 2019-10-22 NOTE — Progress Notes (Addendum)
Pt here for monthly B12 injection per Dr. Nani Ravens  B12 1074mcg given IM in right deltoid, and pt tolerated injection well.  Next B12 injection scheduled for next month.  Noted. Agree with above.  Real, DO 11/01/19 10:32 AM

## 2019-11-25 ENCOUNTER — Other Ambulatory Visit: Payer: Self-pay

## 2019-11-25 ENCOUNTER — Ambulatory Visit (INDEPENDENT_AMBULATORY_CARE_PROVIDER_SITE_OTHER): Payer: Federal, State, Local not specified - PPO

## 2019-11-25 DIAGNOSIS — E538 Deficiency of other specified B group vitamins: Secondary | ICD-10-CM

## 2019-11-25 MED ORDER — CYANOCOBALAMIN 1000 MCG/ML IJ SOLN
1000.0000 ug | Freq: Once | INTRAMUSCULAR | Status: AC
  Administered 2019-11-25: 1000 ug via INTRAMUSCULAR

## 2019-11-25 NOTE — Progress Notes (Signed)
Pt here for monthly B12 injection per Dr.Wendling  B12 1075mcg given IM Left Deltoid , and pt tolerated injection well.  Next B12 injection scheduled for next month.

## 2019-11-26 ENCOUNTER — Ambulatory Visit: Payer: Federal, State, Local not specified - PPO

## 2019-12-24 ENCOUNTER — Other Ambulatory Visit: Payer: Self-pay

## 2019-12-24 ENCOUNTER — Ambulatory Visit (INDEPENDENT_AMBULATORY_CARE_PROVIDER_SITE_OTHER): Payer: Federal, State, Local not specified - PPO

## 2019-12-24 DIAGNOSIS — E538 Deficiency of other specified B group vitamins: Secondary | ICD-10-CM | POA: Diagnosis not present

## 2019-12-24 MED ORDER — CYANOCOBALAMIN 1000 MCG/ML IJ SOLN
1000.0000 ug | Freq: Once | INTRAMUSCULAR | Status: AC
  Administered 2019-12-24: 1000 ug via INTRAMUSCULAR

## 2019-12-24 NOTE — Progress Notes (Addendum)
Pt here for monthly B12 injection per Dr.Wendling  B12 1018mcg given right deltoid IM, and pt tolerated injection well.  Next B12 injection scheduled for next month.  Noted. Agree with above.  [place], DO 12/27/19 4:38 PM

## 2020-01-04 ENCOUNTER — Other Ambulatory Visit: Payer: Self-pay | Admitting: Family Medicine

## 2020-01-21 ENCOUNTER — Other Ambulatory Visit: Payer: Self-pay

## 2020-01-21 ENCOUNTER — Ambulatory Visit (INDEPENDENT_AMBULATORY_CARE_PROVIDER_SITE_OTHER): Payer: Federal, State, Local not specified - PPO

## 2020-01-21 DIAGNOSIS — E538 Deficiency of other specified B group vitamins: Secondary | ICD-10-CM | POA: Diagnosis not present

## 2020-01-21 MED ORDER — CYANOCOBALAMIN 1000 MCG/ML IJ SOLN
1000.0000 ug | Freq: Once | INTRAMUSCULAR | Status: AC
  Administered 2020-01-21: 1000 ug via INTRAMUSCULAR

## 2020-01-21 NOTE — Progress Notes (Signed)
Pt here for monthly B12 injection per Dr. Nani Ravens  B12 1054mcg given IM, and pt tolerated injection well.  Next B12 injection scheduled for next month.

## 2020-02-08 ENCOUNTER — Telehealth: Payer: Self-pay

## 2020-02-08 NOTE — Telephone Encounter (Signed)
Nurse Assessment Nurse: Alinda Money, RN, Sarah Date/Time Eilene Ghazi Time): 02/07/2020 8:05:23 PM Confirm and document reason for call. If symptomatic, describe symptoms. ---Caller states she got her 2nd Covid vaccine on Saturday evening. States her arm is still hurting. Did have some chest pains last night but none now. Also c/o HA. Has the patient had close contact with a person known or suspected to have the novel coronavirus illness OR traveled / lives in area with major community spread (including international travel) in the last 14 days from the onset of symptoms? * If Asymptomatic, screen for exposure and travel within the last 14 days. ---No Does the patient have any new or worsening symptoms? ---Yes Will a triage be completed? ---Yes Related visit to physician within the last 2 weeks? ---No Does the PT have any chronic conditions? (i.e. diabetes, asthma, this includes High risk factors for pregnancy, etc.) ---No Is this a behavioral health or substance abuse call? ---No Guidelines Guideline Title Affirmed Question Affirmed Notes Nurse Date/Time (Eastern Time) COVID-19 - Vaccine Questions and Reactions COVID-19 vaccine, injection site reaction (e.g., pain, redness, swelling), question about Goins, RN, Judson Roch 02/07/2020 8:06:31 PM Disp. Time Eilene Ghazi Time) Disposition Final User 02/07/2020 8:08:16 PM Home Care Yes Goins, RN, Judson Roch PLEASE NOTE: All timestamps contained within this report are represented as [place] Standard Time. CONFIDENTIALTY NOTICE: This fax transmission is intended only for the addressee. It contains information that is legally privileged, confidential or otherwise protected from use or disclosure. If you are not the intended recipient, you are strictly prohibited from reviewing, disclosing, copying using or disseminating any of this information or taking any action in reliance on or regarding this information. If you have received this fax in error, please notify us  immediately by telephone so that we can arrange for its return to [place]. Phone: [phone], Toll-Free: [phone], Fax: [phone] Page: 2 of 2 Call Id: [id number] Caller Disagree/Comply Comply Caller Understands Yes PreDisposition Call Doctor Care Advice Given Per Guideline HOME CARE: * You should be able to treat this at home. * Pain at injection site (84-92%) * Acetaminophen is thought to be safer than ibuprofen or naproxen in people over 60 years old. Acetaminophen is in many OTC and prescription medicines. It might be in more than one medicine that you are taking. You need to be careful and not take an overdose. An acetaminophen overdose can hurt the liver. HEAT PACK FOR LOCAL REACTION AT VACCINE SITE: * Put a heat pack or warm wet washcloth on the vaccine shot area for 10 to 20 minutes. CALL BACK IF: * You become worse. CARE ADVICE given per COVID-19 - Vaccine Questions and Reactions (Adult) guideline

## 2020-02-08 NOTE — Telephone Encounter (Signed)
Called the patient to see how she was doing/arm pain due to injection. The patient stated arm pain due to shot is feeling better and soreness still a little but daily getting better. She will be home today and can rest it/ice if needed and will take tylenol to help lessen the soreness. She will call back if does not continue to improve or worsens

## 2020-02-24 ENCOUNTER — Telehealth: Payer: Self-pay

## 2020-02-24 NOTE — Telephone Encounter (Signed)
Nurse Assessment Nurse: Windle Guard, RN, Olin Hauser Date/Time (Eastern Time): 02/24/2020 10:53:32 AM Confirm and document reason for call. If symptomatic, describe symptoms. ---Caller states she is having post menopausal vaginal bleeding. Has the patient had close contact with a person known or suspected to have the novel coronavirus illness OR traveled / lives in area with major community spread (including international travel) in the last 14 days from the onset of symptoms? * If Asymptomatic, screen for exposure and travel within the last 14 days. ---No Does the patient have any new or worsening symptoms? ---Yes Will a triage be completed? ---Yes Related visit to physician within the last 2 weeks? ---No Does the PT have any chronic conditions? (i.e. diabetes, asthma, this includes High risk factors for pregnancy, etc.) ---No Is this a behavioral health or substance abuse call? ---No Guidelines Guideline Title Affirmed Question Affirmed Notes Nurse Date/Time Eilene Ghazi Time) Vaginal Bleeding - Postmenopausal Postmenopausal vaginal bleeding Conner, RN, Olin Hauser 02/24/2020 10:55:17 AM Disp. Time Eilene Ghazi Time) Disposition Final User 02/24/2020 10:57:30 AM See PCP within 2 Weeks Yes Conner, RN, Otho Najjar Disagree/Comply Comply PLEASE NOTE: All timestamps contained within this report are represented as [place] Standard Time. CONFIDENTIALTY NOTICE: This fax transmission is intended only for the addressee. It contains information that is legally privileged, confidential or otherwise protected from use or disclosure. If you are not the intended recipient, you are strictly prohibited from reviewing, disclosing, copying using or disseminating any of this information or taking any action in reliance on or regarding this information. If you have received this fax in error, please notify us immediately by telephone so that we can arrange for its return to [place]. Phone: [phone], Toll-Free: [phone], Fax:  [phone] Page: 2 of 2 Call Id: [id number] Caller Understands Yes PreDisposition Did not know what to do Care Advice Given Per Guideline SEE PCP WITHIN 2 WEEKS: * You need to be seen for this ongoing problem within the next 2 weeks. * PCP VISIT: Call your doctor (or NP/PA) during regular office hours and make an appointment. CALL BACK IF: * Severe abdominal pain or dizziness occurs * Bleeding increases * You become worse CARE ADVICE given per Vaginal Bleeding, Postmenopausal (Adult) guideline. Referrals REFERRED TO PCP OFFICE

## 2020-02-25 ENCOUNTER — Other Ambulatory Visit: Payer: Self-pay

## 2020-02-25 ENCOUNTER — Ambulatory Visit (INDEPENDENT_AMBULATORY_CARE_PROVIDER_SITE_OTHER): Payer: Federal, State, Local not specified - PPO

## 2020-02-25 DIAGNOSIS — E538 Deficiency of other specified B group vitamins: Secondary | ICD-10-CM

## 2020-02-25 MED ORDER — CYANOCOBALAMIN 1000 MCG/ML IJ SOLN
1000.0000 ug | Freq: Once | INTRAMUSCULAR | Status: AC
Start: 2020-02-25 — End: 2020-02-25
  Administered 2020-02-25: 1000 ug via INTRAMUSCULAR

## 2020-02-25 NOTE — Telephone Encounter (Signed)
appt scheduled today 

## 2020-02-25 NOTE — Progress Notes (Addendum)
Pt here today for B12 injection.   Cyanocobalamin 1044mcg 7mL injected into R deltoid. Pt tolerated injection well.    Next in 4 weeks.  Noted. Agree with above.  [place], DO 02/25/20 4:20 PM

## 2020-02-29 ENCOUNTER — Ambulatory Visit: Payer: Federal, State, Local not specified - PPO | Admitting: Family Medicine

## 2020-02-29 ENCOUNTER — Encounter: Payer: Self-pay | Admitting: Family Medicine

## 2020-02-29 ENCOUNTER — Other Ambulatory Visit: Payer: Self-pay

## 2020-02-29 VITALS — BP 142/98 | HR 81 | Temp 98.0°F | Ht 64.0 in | Wt 197.4 lb

## 2020-02-29 DIAGNOSIS — N950 Postmenopausal bleeding: Secondary | ICD-10-CM

## 2020-02-29 MED ORDER — MEDROXYPROGESTERONE ACETATE 10 MG PO TABS: 10.0000 mg | ORAL_TABLET | Freq: Every day | ORAL | 0 refills | Status: DC

## 2020-02-29 NOTE — Patient Instructions (Addendum)
Someone will reach out regarding your ultrasound.   Keep monitoring your blood pressure. If >140/90 consistently, I want to see you.   Keep the Nov appt with your GYN.  We will be in touch regarding your results.   Let us know if you need anything.
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
## 2020-03-06 ENCOUNTER — Other Ambulatory Visit: Payer: Self-pay | Admitting: Family Medicine

## 2020-03-06 ENCOUNTER — Ambulatory Visit (HOSPITAL_BASED_OUTPATIENT_CLINIC_OR_DEPARTMENT_OTHER)
Admission: RE | Admit: 2020-03-06 | Discharge: 2020-03-06 | Disposition: A | Discharge status: Home / Self Care | Payer: Federal, State, Local not specified - PPO | Source: Ambulatory Visit | Attending: Family Medicine | Admitting: Family Medicine

## 2020-03-06 ENCOUNTER — Other Ambulatory Visit: Payer: Self-pay

## 2020-03-06 DIAGNOSIS — N950 Postmenopausal bleeding: Secondary | ICD-10-CM

## 2020-03-06 DIAGNOSIS — R9389 Abnormal findings on diagnostic imaging of other specified body structures: Secondary | ICD-10-CM | POA: Diagnosis not present

## 2020-03-06 DIAGNOSIS — N939 Abnormal uterine and vaginal bleeding, unspecified: Secondary | ICD-10-CM | POA: Diagnosis not present

## 2020-03-24 ENCOUNTER — Other Ambulatory Visit: Payer: Self-pay

## 2020-03-24 ENCOUNTER — Ambulatory Visit (INDEPENDENT_AMBULATORY_CARE_PROVIDER_SITE_OTHER): Payer: Federal, State, Local not specified - PPO

## 2020-03-24 DIAGNOSIS — E538 Deficiency of other specified B group vitamins: Secondary | ICD-10-CM

## 2020-03-24 MED ORDER — CYANOCOBALAMIN 1000 MCG/ML IJ SOLN
1000.0000 ug | Freq: Once | INTRAMUSCULAR | Status: AC
  Administered 2020-03-24: 1000 ug via INTRAMUSCULAR

## 2020-03-24 NOTE — Progress Notes (Addendum)
Pt here for monthly B12 injection per Dr. Nani Ravens  B12 1064mcg given IM, and pt tolerated injection well.  Next B12 injection scheduled for next month  Reviewed.  Hannah Conley 4:42 PM 03/24/20

## 2020-03-31 ENCOUNTER — Telehealth: Payer: Self-pay | Admitting: Family Medicine

## 2020-03-31 NOTE — Telephone Encounter (Signed)
Caller : Hannah Conley  Call Back # [phone]  Patient states she is coughing and feels like she is getting sick. Patient is requesting a medication be called into the pharmacy.   Patient would like called in  : HYDROcodone-homatropine (HYCODAN) 5-1.5 MG/5ML syrup

## 2020-03-31 NOTE — Telephone Encounter (Signed)
Patient information given to a scheduler who will offer Saturday Clinic appt.

## 2020-03-31 NOTE — Telephone Encounter (Signed)
Appt/you are full

## 2020-03-31 NOTE — Telephone Encounter (Signed)
See if anyone at any office has any openings. TY.

## 2020-03-31 NOTE — Telephone Encounter (Signed)
Patient is checking the status of medication. She would like a phone call back

## 2020-04-01 ENCOUNTER — Telehealth: Payer: Federal, State, Local not specified - PPO | Admitting: Family Medicine

## 2020-04-07 ENCOUNTER — Ambulatory Visit: Payer: Federal, State, Local not specified - PPO | Admitting: Family Medicine

## 2020-04-26 DIAGNOSIS — Z1231 Encounter for screening mammogram for malignant neoplasm of breast: Secondary | ICD-10-CM | POA: Diagnosis not present

## 2020-04-26 DIAGNOSIS — Z01419 Encounter for gynecological examination (general) (routine) without abnormal findings: Secondary | ICD-10-CM | POA: Diagnosis not present

## 2020-04-28 ENCOUNTER — Other Ambulatory Visit: Payer: Self-pay

## 2020-04-28 ENCOUNTER — Ambulatory Visit (INDEPENDENT_AMBULATORY_CARE_PROVIDER_SITE_OTHER): Payer: Federal, State, Local not specified - PPO

## 2020-04-28 ENCOUNTER — Ambulatory Visit: Payer: Federal, State, Local not specified - PPO

## 2020-04-28 DIAGNOSIS — E538 Deficiency of other specified B group vitamins: Secondary | ICD-10-CM | POA: Diagnosis not present

## 2020-04-28 MED ORDER — CYANOCOBALAMIN 1000 MCG/ML IJ SOLN
1000.0000 ug | Freq: Once | INTRAMUSCULAR | Status: AC
  Administered 2020-04-28: 1000 ug via INTRAMUSCULAR

## 2020-04-28 NOTE — Progress Notes (Signed)
Pt is here today for B12 injection. Pt was given B12 injection in right deltoid. Pt tolerated well.     Next B12 injection is scheduled for 05/26/2020 @ 3:30pm

## 2020-05-05 DIAGNOSIS — N950 Postmenopausal bleeding: Secondary | ICD-10-CM | POA: Diagnosis not present

## 2020-05-05 DIAGNOSIS — R1031 Right lower quadrant pain: Secondary | ICD-10-CM | POA: Diagnosis not present

## 2020-05-25 DIAGNOSIS — N390 Urinary tract infection, site not specified: Secondary | ICD-10-CM | POA: Diagnosis not present

## 2020-05-25 DIAGNOSIS — N950 Postmenopausal bleeding: Secondary | ICD-10-CM | POA: Diagnosis not present

## 2020-05-25 DIAGNOSIS — R309 Painful micturition, unspecified: Secondary | ICD-10-CM | POA: Diagnosis not present

## 2020-05-26 ENCOUNTER — Other Ambulatory Visit: Payer: Self-pay

## 2020-05-26 ENCOUNTER — Ambulatory Visit (INDEPENDENT_AMBULATORY_CARE_PROVIDER_SITE_OTHER): Payer: Federal, State, Local not specified - PPO | Admitting: *Deleted

## 2020-05-26 DIAGNOSIS — E538 Deficiency of other specified B group vitamins: Secondary | ICD-10-CM | POA: Diagnosis not present

## 2020-05-26 MED ORDER — CYANOCOBALAMIN 1000 MCG/ML IJ SOLN
1000.0000 ug | Freq: Once | INTRAMUSCULAR | Status: AC
  Administered 2020-05-26: 1000 ug via INTRAMUSCULAR

## 2020-05-26 NOTE — Progress Notes (Signed)
Patient here for monthly b12 injection per Dr. Nani Ravens.    Injection given in left deltoid and patient tolerated well.  Appointment for next injection made.

## 2020-06-02 DIAGNOSIS — N950 Postmenopausal bleeding: Secondary | ICD-10-CM | POA: Diagnosis not present

## 2020-06-15 ENCOUNTER — Telehealth: Payer: Self-pay | Admitting: Family Medicine

## 2020-06-15 MED ORDER — CEPHALEXIN 500 MG PO CAPS: 500.0000 mg | ORAL_CAPSULE | Freq: Four times a day (QID) | ORAL | 0 refills | Status: AC

## 2020-06-15 NOTE — Telephone Encounter (Signed)
Patient has a 2 boils on her scalp that are causing her pain.   Per patient they are recurring and she normally just takes an antibiotic and do warm compresses for treatment.   She would like an antibiotic called in for her to the preferred pharmacy.   Preferred Pharmacy:  [id number] [address] Phone:  [phone]  Fax:  [phone]     Patient is scheduled for an appt on 06/21/2020.

## 2020-06-15 NOTE — Telephone Encounter (Signed)
I called one in due to my absence. In the future or if she does not improve, she will need to be seen. Ty.

## 2020-06-15 NOTE — Telephone Encounter (Signed)
Spoke w/ Pt- informed that PCP sent abx to [facility]. Instructed to keep her scheduled appt for 06/21/20 for follow-up. Pt verbalized understanding and very thankfully.

## 2020-06-15 NOTE — Telephone Encounter (Signed)
Nurse Assessment Nurse: Stefano Gaul, RN, Dwana Curd Date/Time Lamount Cohen Time): 06/15/2020 7:22:22 AM Confirm and document reason for call. If symptomatic, describe symptoms. ---Caller states she has 2 boils on the top of her head. Has had boils since Sunday. She is nauseated and headache. Thinks she has a fever. has not checked temp. Has severe headache. Does the patient have any new or worsening symptoms? ---Yes Will a triage be completed? ---Yes Related visit to physician within the last 2 weeks? ---No Does the PT have any chronic conditions? (i.e. diabetes, asthma, this includes High risk factors for pregnancy, etc.) ---Yes List chronic conditions. ---heart murmur Is this a behavioral health or substance abuse call? ---No Guidelines Guideline Title Affirmed Question Affirmed Notes Nurse Date/Time (Eastern Time) Boil (Skin Abscess) SEVERE pain (e.g., excruciating) Stefano Gaul, RN, Dwana Curd 06/15/2020 7:25:30 AM Disp. Time Lamount Cohen Time) Disposition Final User 06/15/2020 7:28:42 AM See HCP within 4 Hours (or PCP triage) Yes Stefano Gaul, RN, Clerance Lav Disagree/Comply Comply Caller Understands Yes PLEASE NOTE: All timestamps contained within this report are represented as [place] Standard Time. CONFIDENTIALTY NOTICE: This fax transmission is intended only for the addressee. It contains information that is legally privileged, confidential or otherwise protected from use or disclosure. If you are not the intended recipient, you are strictly prohibited from reviewing, disclosing, copying using or disseminating any of this information or taking any action in reliance on or regarding this information. If you have received this fax in error, please notify us immediately by telephone so that we can arrange for its return to [place]. Phone: [phone], Toll-Free: [phone], Fax: [phone] Page: 2 of 2 Call Id: [id number] PreDisposition Home Care Care Advice Given Per Guideline SEE HCP (OR PCP TRIAGE) WITHIN  4 HOURS: * IF OFFICE WILL BE OPEN: You need to be seen within the next 3 or 4 hours. Call your doctor (or NP/PA) now or as soon as the office opens. CALL BACK IF: * You become worse CARE ADVICE per Boil (Skin Abscess) (Adult) guideline. Referrals REFERRED TO PCP OFFICE

## 2020-06-19 ENCOUNTER — Telehealth: Payer: Self-pay

## 2020-06-19 NOTE — Telephone Encounter (Signed)
Nurse Assessment Nurse: Juliette Alcide Date/Time Hannah Conley Time): 06/15/2020 9:05:37 PM Confirm and document reason for call. If symptomatic, describe symptoms. ---Caller states she has boil started Sunday morning. She has medication for it (Keflex) which started today. C/O head pain from boil, denies other symptoms. Does the patient have any new or worsening symptoms? ---Yes Will a triage be completed? ---Yes Related visit to physician within the last 2 weeks? ---Yes Does the PT have any chronic conditions? (i.e. diabetes, asthma, this includes High risk factors for pregnancy, etc.) ---Yes List chronic conditions. ---HTN Is this a behavioral health or substance abuse call? ---No Guidelines Guideline Title Affirmed Question Affirmed Notes Nurse Date/Time (Eastern Time) Boil (Skin Abscess) SEVERE pain (e.g., excruciating) Juliette Alcide 06/15/2020 9:07:55 PM Disp. Time Hannah Conley Time) Disposition Final User 06/15/2020 8:53:25 PM Send to Clinical Crissie Figures, RN, Clerance Lav 06/15/2020 9:17:32 PM See HCP within 4 Hours (or PCP triage) Yes Juliette Alcide Caller Disagree/Comply Disagree PLEASE NOTE: All timestamps contained within this report are represented as [place] Standard Time. CONFIDENTIALTY NOTICE: This fax transmission is intended only for the addressee. It contains information that is legally privileged, confidential or otherwise protected from use or disclosure. If you are not the intended recipient, you are strictly prohibited from reviewing, disclosing, copying using or disseminating any of this information or taking any action in reliance on or regarding this information. If you have received this fax in error, please notify us immediately by telephone so that we can arrange for its return to [place]. Phone: [phone], Toll-Free: [phone], Fax: [phone] Page: 2 of 2 Call Id: [id number] Caller Understands Yes PreDisposition Home Care Care Advice Given Per Guideline SEE HCP (OR  PCP TRIAGE) WITHIN 4 HOURS: * IF OFFICE WILL BE OPEN: You need to be seen within the next 3 or 4 hours. Call your doctor (or NP/PA) now or as soon as the office opens. * IF OFFICE WILL BE CLOSED AND NO PCP (PRIMARY CARE PROVIDER) SECOND-LEVEL TRIAGE: You need to be seen within the next 3 or 4 hours. A nearby Urgent Care Center [facility]) is often a good source of care. Another choice is to go to the ED. Go sooner if you become worse. PAIN MEDICINES: * For pain relief, you can take either acetaminophen, ibuprofen, or naproxen. * They are over-the-counter (OTC) pain drugs. You can buy them at the drugstore. * IBUPROFEN (E.G., MOTRIN, ADVIL): Take 400 mg (two 200 mg pills) by mouth every 6 hours. The most you should take each day is 1,200 mg (six 200 mg pills), unless your doctor has told you to take more. CALL BACK IF: * You become worse CARE ADVICE per Boil (Skin Abscess) (Adult) guideline. DRESSING: * Cover with a sterile gauze or clean cloth. * Do this until seen. Referrals GO TO FACILITY REFUSED

## 2020-06-20 NOTE — Telephone Encounter (Signed)
appt scheduled

## 2020-06-21 ENCOUNTER — Encounter: Payer: Self-pay | Admitting: Family Medicine

## 2020-06-21 ENCOUNTER — Other Ambulatory Visit: Payer: Self-pay

## 2020-06-21 ENCOUNTER — Ambulatory Visit: Payer: Federal, State, Local not specified - PPO | Admitting: Family Medicine

## 2020-06-21 VITALS — BP 128/80 | HR 79 | Temp 98.3°F | Ht 64.0 in | Wt 205.0 lb

## 2020-06-21 DIAGNOSIS — L0292 Furuncle, unspecified: Secondary | ICD-10-CM
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
## 2020-06-21 NOTE — Patient Instructions (Signed)
Let me know if this happens again.  Let us know if you need anything.

## 2020-06-28 ENCOUNTER — Encounter: Payer: Self-pay | Admitting: Family Medicine

## 2020-06-28 ENCOUNTER — Other Ambulatory Visit: Payer: Self-pay

## 2020-06-28 ENCOUNTER — Ambulatory Visit (INDEPENDENT_AMBULATORY_CARE_PROVIDER_SITE_OTHER): Payer: Federal, State, Local not specified - PPO | Admitting: Family Medicine

## 2020-06-28 VITALS — BP 152/90 | HR 70 | Temp 98.0°F | Ht 64.0 in | Wt 205.4 lb

## 2020-06-28 DIAGNOSIS — E538 Deficiency of other specified B group vitamins: Secondary | ICD-10-CM

## 2020-06-28 DIAGNOSIS — K644 Residual hemorrhoidal skin tags: Secondary | ICD-10-CM

## 2020-06-28 DIAGNOSIS — I10 Essential (primary) hypertension: Secondary | ICD-10-CM | POA: Diagnosis not present

## 2020-06-28 MED ORDER — LIDOCAINE-HYDROCORTISONE ACE 3-0.5 % EX CREA
TOPICAL_CREAM | CUTANEOUS | 1 refills | Status: DC
Start: 2020-06-28 — End: 2020-09-13

## 2020-06-28 MED ORDER — HYDROCORTISONE 2.5 % EX CREA: TOPICAL_CREAM | Freq: Two times a day (BID) | CUTANEOUS | 0 refills | Status: DC

## 2020-06-28 MED ORDER — CYANOCOBALAMIN 1000 MCG/ML IJ SOLN
1000.0000 ug | Freq: Once | INTRAMUSCULAR | Status: AC
  Administered 2020-06-28: 1000 ug via INTRAMUSCULAR

## 2020-06-28 MED ORDER — CARVEDILOL 12.5 MG PO TABS: 12.5000 mg | ORAL_TABLET | Freq: Two times a day (BID) | ORAL | 2 refills | Status: DC
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
## 2020-06-28 NOTE — Patient Instructions (Addendum)
Stay hydrated.  Continue monitoring blood pressure at home.  We are done with the metoprolol and substituting it with Coreg. Continue your other medications.  Make sure your bowel movements are normal.   Let us know if you need anything.

## 2020-06-30 ENCOUNTER — Ambulatory Visit: Payer: Federal, State, Local not specified - PPO | Admitting: Family Medicine

## 2020-06-30 ENCOUNTER — Ambulatory Visit: Payer: Federal, State, Local not specified - PPO

## 2020-07-11 ENCOUNTER — Encounter: Payer: Self-pay | Admitting: Family Medicine

## 2020-07-11 ENCOUNTER — Telehealth (INDEPENDENT_AMBULATORY_CARE_PROVIDER_SITE_OTHER): Payer: Federal, State, Local not specified - PPO | Admitting: Family Medicine

## 2020-07-11 ENCOUNTER — Other Ambulatory Visit: Payer: Self-pay

## 2020-07-11 DIAGNOSIS — J0140 Acute pansinusitis, unspecified: Secondary | ICD-10-CM | POA: Diagnosis not present

## 2020-07-11 MED ORDER — DOXYCYCLINE HYCLATE 100 MG PO TABS: 100.0000 mg | ORAL_TABLET | Freq: Two times a day (BID) | ORAL | 0 refills | Status: DC

## 2020-07-11 MED ORDER — FLUCONAZOLE 150 MG PO TABS: ORAL_TABLET | ORAL | 0 refills | Status: DC

## 2020-07-11 MED ORDER — PREDNISONE 20 MG PO TABS: 40.0000 mg | ORAL_TABLET | Freq: Every day | ORAL | 0 refills | Status: AC
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
## 2020-07-17 ENCOUNTER — Telehealth: Payer: Self-pay

## 2020-07-17 ENCOUNTER — Telehealth (INDEPENDENT_AMBULATORY_CARE_PROVIDER_SITE_OTHER): Payer: Federal, State, Local not specified - PPO | Admitting: Family Medicine

## 2020-07-17 ENCOUNTER — Other Ambulatory Visit: Payer: Self-pay

## 2020-07-17 ENCOUNTER — Encounter: Payer: Self-pay | Admitting: Family Medicine

## 2020-07-17 DIAGNOSIS — J0140 Acute pansinusitis, unspecified: Secondary | ICD-10-CM

## 2020-07-17 MED ORDER — CEFDINIR 300 MG PO CAPS: 300.0000 mg | ORAL_CAPSULE | Freq: Two times a day (BID) | ORAL | 0 refills | Status: AC

## 2020-07-17 NOTE — Telephone Encounter (Signed)
Nurse Assessment Nurse: Terence Lux, RN, Jana Date/Time (Eastern Time): 07/16/2020 5:40:05 PM Confirm and document reason for call. If symptomatic, describe symptoms. ---Caller states that her sinus infection is not getting better, head and face hurting. Diagnosed on Monday with sinus infection. Given Prednisone. Fever is 100.4. no N/V. Has headache Does the patient have any new or worsening symptoms? ---Yes Will a triage be completed? ---Yes Related visit to physician within the last 2 weeks? ---Yes Does the PT have any chronic conditions? (i.e. diabetes, asthma, this includes High risk factors for pregnancy, etc.) ---Yes List chronic conditions. ---HTN Is this a behavioral health or substance abuse call? ---No Guidelines Guideline Title Affirmed Question Affirmed Notes Nurse Date/Time Eilene Ghazi Time) Sinus Infection on Antibiotic Follow-up Call [1] SEVERE headache AND [2] fever Terence Lux, RN, Ashok Norris 07/16/2020 5:41:43 PM Disp. Time Eilene Ghazi Time) Disposition Final User 07/16/2020 5:47:20 PM Go to ED Now (or PCP triage) Yes Terence Lux, RN, Damien Fusi Disagree/Comply Disagree Caller Understands Yes PreDisposition Call Doctor PLEASE NOTE: All timestamps contained within this report are represented as [place] Standard Time. CONFIDENTIALTY NOTICE: This fax transmission is intended only for the addressee. It contains information that is legally privileged, confidential or otherwise protected from use or disclosure. If you are not the intended recipient, you are strictly prohibited from reviewing, disclosing, copying using or disseminating any of this information or taking any action in reliance on or regarding this information. If you have received this fax in error, please notify us immediately by telephone so that we can arrange for its return to [place]. Phone: [phone], Toll-Free: [phone], Fax: [phone] Page: 2 of 2 Call Id: [id number] Care Advice Given Per Guideline * IF NO PCP  (PRIMARY CARE PROVIDER) SECOND-LEVEL TRIAGE: You need to be seen within the next hour. Go to the [facility] at _____________ [place] as soon as you can. ANOTHER ADULT SHOULD DRIVE: * It is better and safer if another adult drives instead of you. * For pain or fever relief, take either acetaminophen or ibuprofen. BRING MEDICINES: Referrals GO TO FACILITY UNDECIDED   Pt scheduled today w/ Dr. Nani Ravens.
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
## 2020-07-28 ENCOUNTER — Other Ambulatory Visit: Payer: Self-pay

## 2020-07-28 ENCOUNTER — Encounter: Payer: Self-pay | Admitting: Family Medicine

## 2020-07-28 ENCOUNTER — Ambulatory Visit: Payer: Federal, State, Local not specified - PPO | Admitting: Family Medicine

## 2020-07-28 ENCOUNTER — Other Ambulatory Visit: Payer: Self-pay | Admitting: Family Medicine

## 2020-07-28 VITALS — BP 160/90 | HR 75 | Temp 97.9°F | Resp 18 | Wt 210.4 lb

## 2020-07-28 DIAGNOSIS — L659 Nonscarring hair loss, unspecified: Secondary | ICD-10-CM

## 2020-07-28 DIAGNOSIS — E538 Deficiency of other specified B group vitamins: Secondary | ICD-10-CM

## 2020-07-28 DIAGNOSIS — I10 Essential (primary) hypertension: Secondary | ICD-10-CM

## 2020-07-28 MED ORDER — FINASTERIDE 1 MG PO TABS: 1.0000 mg | ORAL_TABLET | Freq: Every day | ORAL | 2 refills | Status: DC

## 2020-07-28 MED ORDER — CYANOCOBALAMIN 1000 MCG/ML IJ SOLN
1000.0000 ug | Freq: Once | INTRAMUSCULAR | Status: AC
  Administered 2020-07-28: 1000 ug via INTRAMUSCULAR

## 2020-07-28 MED ORDER — AMLODIPINE BESYLATE 5 MG PO TABS: 5.0000 mg | ORAL_TABLET | Freq: Every day | ORAL | 3 refills | Status: DC

## 2020-07-28 NOTE — Patient Instructions (Addendum)
Take some Biotin daily.   Take ferrous sulfate 325 mg up to 3 times daily to help with hair thickness.  Keep the diet clean and stay active.  Continue to monitor BP at home.  Take Coreg in addition to the Norvasc.   Use [facility] as a free website or app on the phone.   Let us know if you need anything.
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
## 2020-08-11 IMAGING — DX DG ABDOMEN 2V
3 series · 3 of 3 positions shown · non-contrast
Comparison: CT scan of March 07, 2012.

CLINICAL DATA: Right flank pain for 6 months without known injury.

EXAM:
ABDOMEN - 2 VIEW

[abdomen erect]
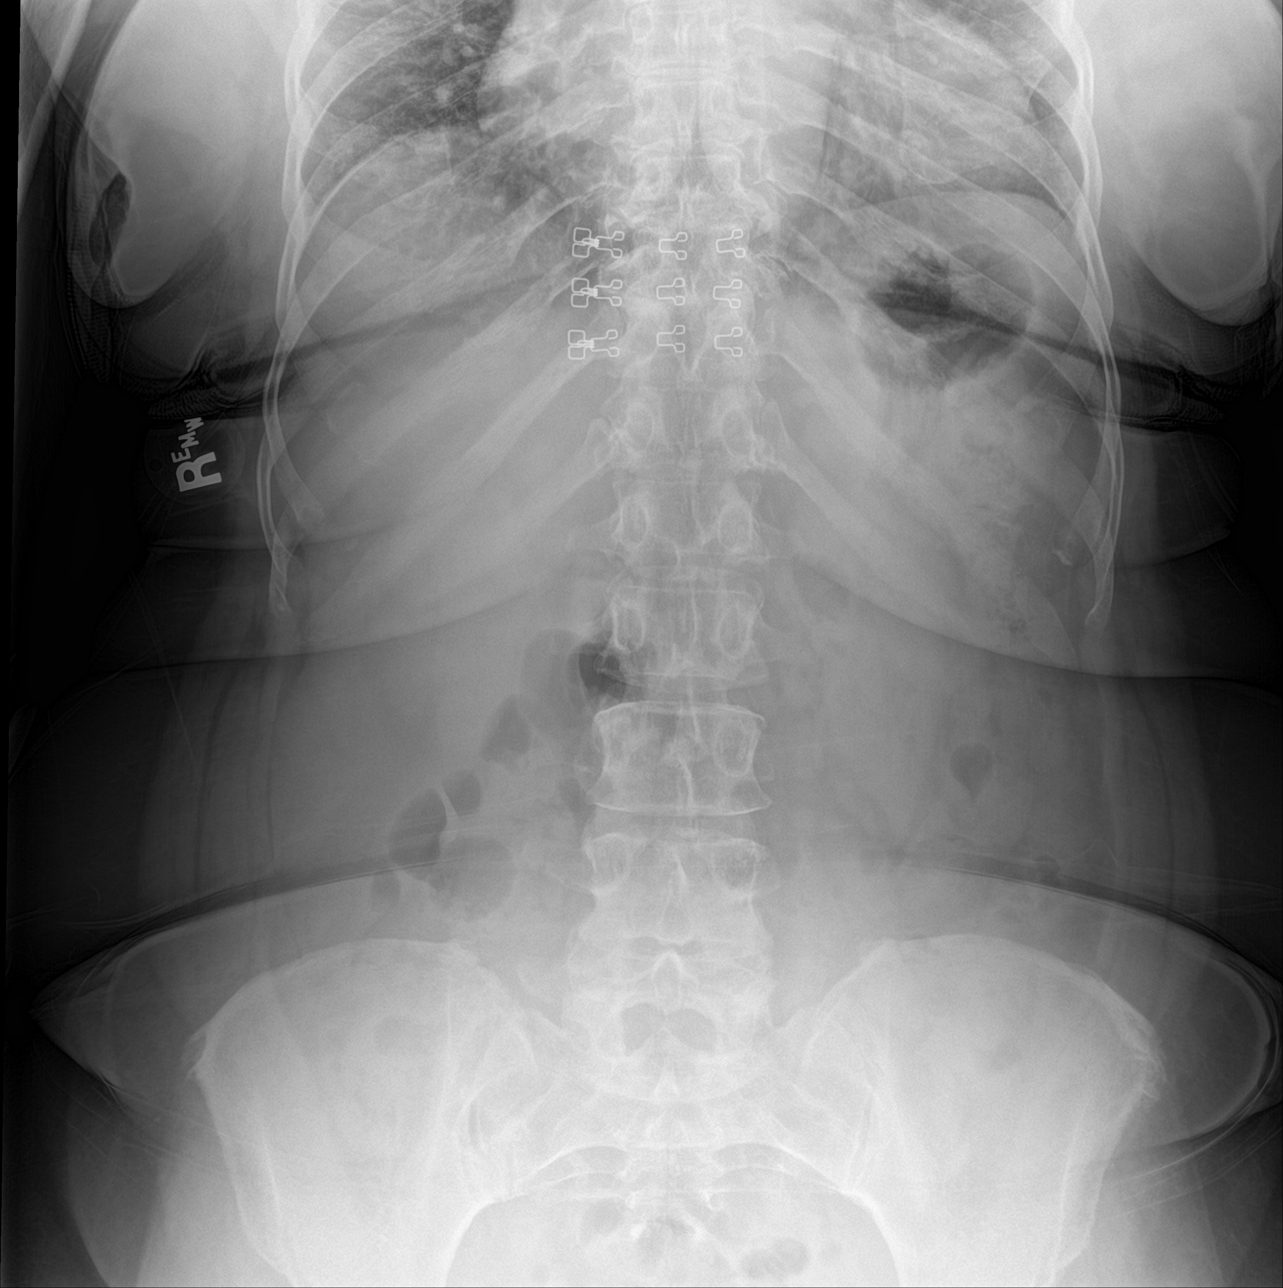

[abdomen supine (1 of 2)]
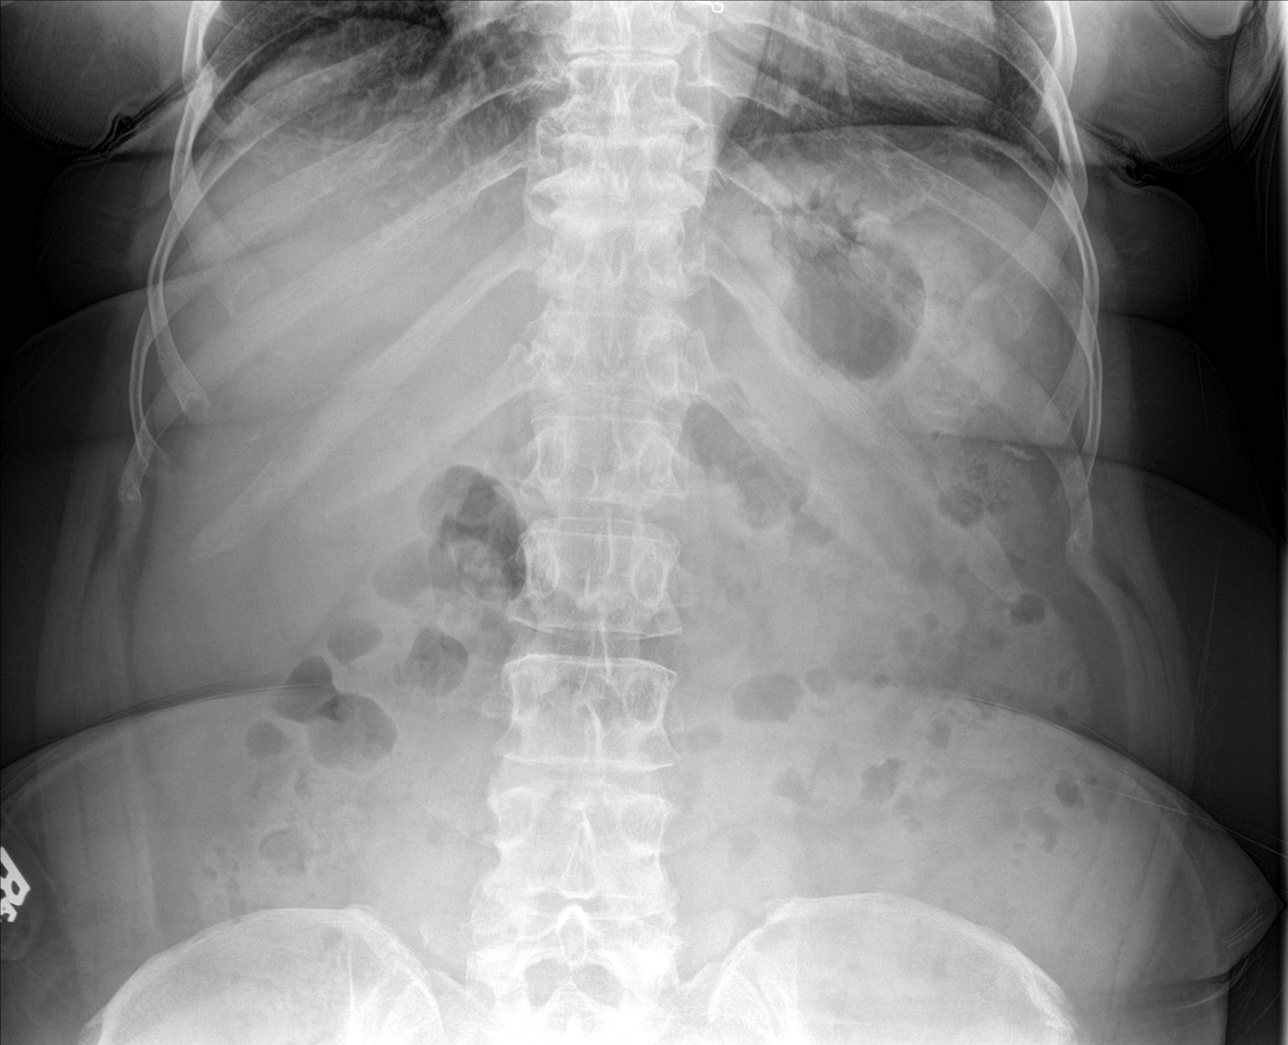

[abdomen supine (2 of 2)]
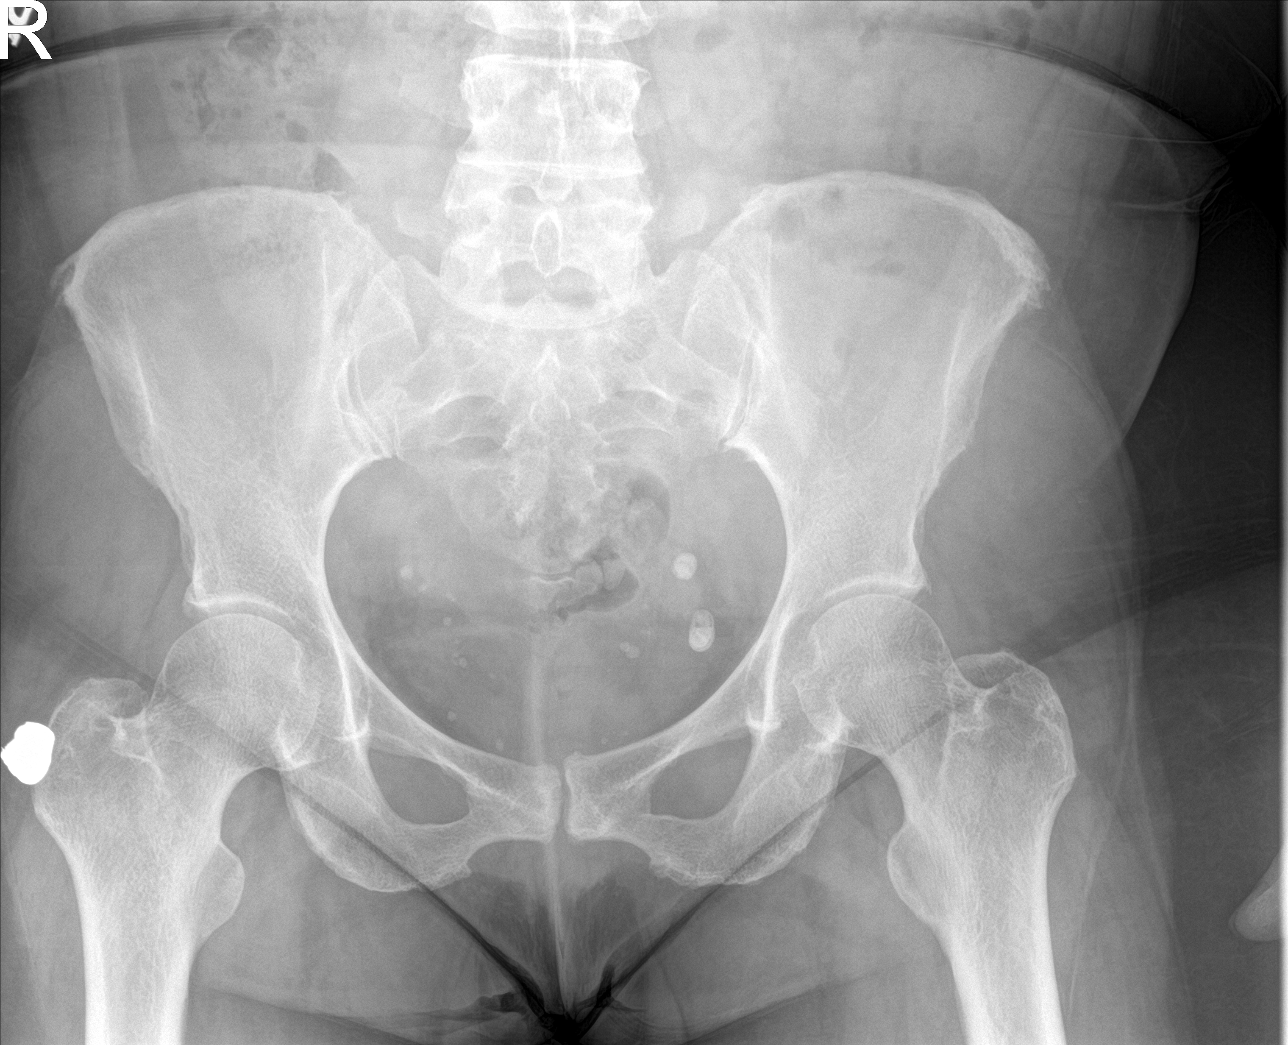

[3 of 3 positions shown; findings below may reference images not displayed]

FINDINGS: The bowel gas pattern is normal. There is no evidence of free air.
No nephrolithiasis is noted. Probable phleboliths are noted in the
pelvis.
IMPRESSION: No evidence of bowel obstruction or ileus. No definite
nephrolithiasis.

## 2020-08-25 ENCOUNTER — Encounter: Payer: Self-pay | Admitting: Family Medicine

## 2020-08-25 ENCOUNTER — Other Ambulatory Visit: Payer: Self-pay

## 2020-08-25 ENCOUNTER — Ambulatory Visit (INDEPENDENT_AMBULATORY_CARE_PROVIDER_SITE_OTHER): Payer: Federal, State, Local not specified - PPO | Admitting: Family Medicine

## 2020-08-25 VITALS — BP 124/72 | HR 85 | Resp 17 | Ht 64.0 in | Wt 210.0 lb

## 2020-08-25 DIAGNOSIS — E538 Deficiency of other specified B group vitamins: Secondary | ICD-10-CM

## 2020-08-25 DIAGNOSIS — I10 Essential (primary) hypertension: Secondary | ICD-10-CM
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
## 2020-08-25 NOTE — Patient Instructions (Signed)
Keep the diet clean and stay active.  Give [place] 2-3 business days to get the results of your labs back.   Keep an eye on your blood pressure. If it gets >140/90, go back on your blood pressure medication and let me know please.   Let us know if you need anything.

## 2020-08-26 LAB — CBC
HCT: 37.7 % (ref 35.0–45.0)
Hemoglobin: 12.7 g/dL (ref 11.7–15.5)
MCH: 30.2 pg (ref 27.0–33.0)
MCHC: 33.7 g/dL (ref 32.0–36.0)
MCV: 89.5 fL (ref 80.0–100.0)
MPV: 10.3 fL (ref 7.5–12.5)
Platelets: 287 10*3/uL (ref 140–400)
RBC: 4.21 10*6/uL (ref 3.80–5.10)
RDW: 13.1 % (ref 11.0–15.0)
WBC: 5 10*3/uL (ref 3.8–10.8)

## 2020-08-26 LAB — COMPREHENSIVE METABOLIC PANEL
AG Ratio: 1.7 (calc) (ref 1.0–2.5)
ALT: 8 U/L (ref 6–29)
AST: 12 U/L (ref 10–35)
Albumin: 4.3 g/dL (ref 3.6–5.1)
Alkaline phosphatase (APISO): 84 U/L (ref 37–153)
BUN: 8 mg/dL (ref 7–25)
CO2: 26 mmol/L (ref 20–32)
Calcium: 9.3 mg/dL (ref 8.6–10.4)
Chloride: 105 mmol/L (ref 98–110)
Creat: 0.85 mg/dL (ref 0.50–1.05)
Globulin: 2.5 g/dL (calc) (ref 1.9–3.7)
Glucose, Bld: 88 mg/dL (ref 65–99)
Potassium: 4.1 mmol/L (ref 3.5–5.3)
Sodium: 140 mmol/L (ref 135–146)
Total Bilirubin: 0.4 mg/dL (ref 0.2–1.2)
Total Protein: 6.8 g/dL (ref 6.1–8.1)

## 2020-08-26 LAB — VITAMIN B12: Vitamin B-12: 523 pg/mL (ref 200–1100)

## 2020-08-28 MED ORDER — CYANOCOBALAMIN 1000 MCG/ML IJ SOLN
1000.0000 ug | Freq: Once | INTRAMUSCULAR | Status: AC
  Administered 2020-08-25: 1000 ug via INTRAMUSCULAR

## 2020-08-28 NOTE — Addendum Note (Signed)
Addended by: Wynonia Musty A on: 08/28/2020 10:11 AM   Modules accepted: Orders

## 2020-09-13 ENCOUNTER — Other Ambulatory Visit: Payer: Self-pay

## 2020-09-13 ENCOUNTER — Encounter: Payer: Self-pay | Admitting: Family Medicine

## 2020-09-13 ENCOUNTER — Emergency Department (HOSPITAL_BASED_OUTPATIENT_CLINIC_OR_DEPARTMENT_OTHER): Payer: Federal, State, Local not specified - PPO

## 2020-09-13 ENCOUNTER — Encounter (HOSPITAL_BASED_OUTPATIENT_CLINIC_OR_DEPARTMENT_OTHER): Payer: Self-pay

## 2020-09-13 ENCOUNTER — Ambulatory Visit (INDEPENDENT_AMBULATORY_CARE_PROVIDER_SITE_OTHER): Payer: Federal, State, Local not specified - PPO | Admitting: Family Medicine

## 2020-09-13 ENCOUNTER — Emergency Department (HOSPITAL_BASED_OUTPATIENT_CLINIC_OR_DEPARTMENT_OTHER)
Admission: EM | Admit: 2020-09-13 | Discharge: 2020-09-14 | Disposition: A | Discharge status: Home / Self Care | Payer: Federal, State, Local not specified - PPO | Attending: Emergency Medicine | Admitting: Emergency Medicine

## 2020-09-13 VITALS — BP 144/86 | HR 83 | Temp 98.3°F | Ht 64.0 in | Wt 211.2 lb

## 2020-09-13 DIAGNOSIS — R319 Hematuria, unspecified: Secondary | ICD-10-CM | POA: Diagnosis not present

## 2020-09-13 DIAGNOSIS — I10 Essential (primary) hypertension: Secondary | ICD-10-CM | POA: Diagnosis not present

## 2020-09-13 DIAGNOSIS — R1030 Lower abdominal pain, unspecified: Secondary | ICD-10-CM | POA: Diagnosis not present

## 2020-09-13 DIAGNOSIS — K921 Melena: Secondary | ICD-10-CM | POA: Diagnosis not present

## 2020-09-13 DIAGNOSIS — R030 Elevated blood-pressure reading, without diagnosis of hypertension: Secondary | ICD-10-CM

## 2020-09-13 DIAGNOSIS — Z79899 Other long term (current) drug therapy: Secondary | ICD-10-CM | POA: Insufficient documentation

## 2020-09-13 DIAGNOSIS — K429 Umbilical hernia without obstruction or gangrene: Secondary | ICD-10-CM | POA: Diagnosis not present

## 2020-09-13 LAB — CBC WITH DIFFERENTIAL/PLATELET
Abs Immature Granulocytes: 0.01 10*3/uL (ref 0.00–0.07)
Basophils Absolute: 0 10*3/uL (ref 0.0–0.1)
Basophils Relative: 1 %
Eosinophils Absolute: 0.1 10*3/uL (ref 0.0–0.5)
Eosinophils Relative: 2 %
HCT: 36.2 % (ref 36.0–46.0)
Hemoglobin: 12.4 g/dL (ref 12.0–15.0)
Immature Granulocytes: 0 %
Lymphocytes Relative: 64 %
Lymphs Abs: 3.9 10*3/uL (ref 0.7–4.0)
MCH: 30.1 pg (ref 26.0–34.0)
MCHC: 34.3 g/dL (ref 30.0–36.0)
MCV: 87.9 fL (ref 80.0–100.0)
Monocytes Absolute: 0.4 10*3/uL (ref 0.1–1.0)
Monocytes Relative: 7 %
Neutro Abs: 1.6 10*3/uL — ABNORMAL LOW (ref 1.7–7.7)
Neutrophils Relative %: 26 %
Platelets: 307 10*3/uL (ref 150–400)
RBC: 4.12 MIL/uL (ref 3.87–5.11)
RDW: 12.4 % (ref 11.5–15.5)
WBC: 6 10*3/uL (ref 4.0–10.5)
nRBC: 0 % (ref 0.0–0.2)

## 2020-09-13 LAB — URINALYSIS, ROUTINE W REFLEX MICROSCOPIC
Bilirubin Urine: NEGATIVE
Glucose, UA: NEGATIVE mg/dL
Hgb urine dipstick: NEGATIVE
Ketones, ur: NEGATIVE mg/dL
Leukocytes,Ua: NEGATIVE
Nitrite: NEGATIVE
Protein, ur: NEGATIVE mg/dL
Specific Gravity, Urine: <1.005 — ABNORMAL LOW (ref 1.005–1.030)
pH: 6.5 (ref 5.0–8.0)

## 2020-09-13 MED ORDER — DICYCLOMINE HCL 10 MG PO CAPS: ORAL_CAPSULE | ORAL | 0 refills | Status: DC

## 2020-09-13 NOTE — Patient Instructions (Addendum)
If you do not hear anything about your referral in the next 1-2 weeks, call our office and ask for an update.  Try to find out what specific triggers are associated with your pain.   Stay hydrated.   Keep an eye on your blood pressure at home. If >140/90, let me know.   Let us know if you need anything.

## 2020-09-13 NOTE — ED Notes (Signed)
Patient to CT scan

## 2020-09-13 NOTE — ED Triage Notes (Signed)
Pt c/o blood in urine x today-states she has been having blood in stools x 5 days-was seen by PCP today with GI referral appt 4/18-NAD-steady gait
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
## 2020-09-14 ENCOUNTER — Telehealth: Payer: Self-pay | Admitting: *Deleted

## 2020-09-14 LAB — BASIC METABOLIC PANEL
Anion gap: 8 (ref 5–15)
BUN: 10 mg/dL (ref 6–20)
CO2: 25 mmol/L (ref 22–32)
Calcium: 9 mg/dL (ref 8.9–10.3)
Chloride: 106 mmol/L (ref 98–111)
Creatinine, Ser: 0.87 mg/dL (ref 0.44–1.00)
GFR, Estimated: >60 mL/min (ref >60)
Glucose, Bld: 101 mg/dL — ABNORMAL HIGH (ref 70–99)
Potassium: 3.8 mmol/L (ref 3.5–5.1)
Sodium: 139 mmol/L (ref 135–145)
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
## 2020-09-14 NOTE — Discharge Instructions (Addendum)
Your work-up today is reassuring.  You do not have any evidence of infection or kidney stone.  Your urine actually does not show any blood at this time.  Follow-up with your primary physician.  Regarding ongoing issues with rectal bleeding, follow-up with GI as previously referred.

## 2020-09-14 NOTE — ED Notes (Signed)
Patient back from CT scan.

## 2020-09-14 NOTE — Telephone Encounter (Signed)
FYI Patient went to ER  Return Phone Number [phone] (Primary) Chief Complaint Urine, Blood In Reason for Call Symptomatic / Request for Guy states she have blood in her urine. She just saw the doctor today [facility] Translation No Nurse Assessment Nurse: Sabra Heck, RN, Cecilia Date/Time (Eastern Time): 09/13/2020 9:52:04 PM Confirm and document reason for call. If symptomatic, describe symptoms. ---Caller states she has blood in her urine. She states that it started today. It is bright red. denies any pain or burning with urination. She just saw the doctor today for blood in stool and was not having blood in urine at the time.

## 2020-09-19 ENCOUNTER — Other Ambulatory Visit: Payer: Self-pay

## 2020-09-19 ENCOUNTER — Other Ambulatory Visit: Payer: Self-pay | Admitting: Family Medicine

## 2020-09-19 ENCOUNTER — Encounter (HOSPITAL_COMMUNITY): Payer: Self-pay

## 2020-09-19 ENCOUNTER — Emergency Department (HOSPITAL_COMMUNITY)
Admission: EM | Admit: 2020-09-19 | Discharge: 2020-09-19 | Disposition: A | Discharge status: Home / Self Care | Payer: Federal, State, Local not specified - PPO | Attending: Emergency Medicine | Admitting: Emergency Medicine

## 2020-09-19 DIAGNOSIS — R5383 Other fatigue: Secondary | ICD-10-CM | POA: Diagnosis not present

## 2020-09-19 DIAGNOSIS — R1084 Generalized abdominal pain: Secondary | ICD-10-CM | POA: Diagnosis not present

## 2020-09-19 DIAGNOSIS — K921 Melena: Secondary | ICD-10-CM | POA: Insufficient documentation

## 2020-09-19 DIAGNOSIS — I10 Essential (primary) hypertension: Secondary | ICD-10-CM | POA: Diagnosis not present

## 2020-09-19 DIAGNOSIS — R319 Hematuria, unspecified: Secondary | ICD-10-CM | POA: Insufficient documentation

## 2020-09-19 DIAGNOSIS — Z79899 Other long term (current) drug therapy: Secondary | ICD-10-CM | POA: Diagnosis not present

## 2020-09-19 LAB — PROTIME-INR
INR: 0.9 (ref 0.8–1.2)
Prothrombin Time: 11.9 seconds (ref 11.4–15.2)

## 2020-09-19 LAB — COMPREHENSIVE METABOLIC PANEL
ALT: 13 U/L (ref 0–44)
AST: 16 U/L (ref 15–41)
Albumin: 4 g/dL (ref 3.5–5.0)
Alkaline Phosphatase: 80 U/L (ref 38–126)
Anion gap: 6 (ref 5–15)
BUN: 9 mg/dL (ref 6–20)
CO2: 26 mmol/L (ref 22–32)
Calcium: 9.2 mg/dL (ref 8.9–10.3)
Chloride: 109 mmol/L (ref 98–111)
Creatinine, Ser: 0.85 mg/dL (ref 0.44–1.00)
GFR, Estimated: >60 mL/min (ref >60)
Glucose, Bld: 97 mg/dL (ref 70–99)
Potassium: 4 mmol/L (ref 3.5–5.1)
Sodium: 141 mmol/L (ref 135–145)
Total Bilirubin: 0.4 mg/dL (ref 0.3–1.2)
Total Protein: 7.2 g/dL (ref 6.5–8.1)

## 2020-09-19 LAB — URINALYSIS, ROUTINE W REFLEX MICROSCOPIC
Bilirubin Urine: NEGATIVE
Glucose, UA: NEGATIVE mg/dL
Hgb urine dipstick: NEGATIVE
Ketones, ur: NEGATIVE mg/dL
Leukocytes,Ua: NEGATIVE
Nitrite: NEGATIVE
Protein, ur: NEGATIVE mg/dL
Specific Gravity, Urine: 1.01 (ref 1.005–1.030)
pH: 7 (ref 5.0–8.0)

## 2020-09-19 LAB — CBC WITH DIFFERENTIAL/PLATELET
Abs Immature Granulocytes: 0.01 10*3/uL (ref 0.00–0.07)
Basophils Absolute: 0 10*3/uL (ref 0.0–0.1)
Basophils Relative: 1 %
Eosinophils Absolute: 0.1 10*3/uL (ref 0.0–0.5)
Eosinophils Relative: 1 %
HCT: 37.6 % (ref 36.0–46.0)
Hemoglobin: 12.6 g/dL (ref 12.0–15.0)
Immature Granulocytes: 0 %
Lymphocytes Relative: 59 %
Lymphs Abs: 2.9 10*3/uL (ref 0.7–4.0)
MCH: 30.3 pg (ref 26.0–34.0)
MCHC: 33.5 g/dL (ref 30.0–36.0)
MCV: 90.4 fL (ref 80.0–100.0)
Monocytes Absolute: 0.4 10*3/uL (ref 0.1–1.0)
Monocytes Relative: 8 %
Neutro Abs: 1.5 10*3/uL — ABNORMAL LOW (ref 1.7–7.7)
Neutrophils Relative %: 31 %
Platelets: 284 10*3/uL (ref 150–400)
RBC: 4.16 MIL/uL (ref 3.87–5.11)
RDW: 12.5 % (ref 11.5–15.5)
WBC: 4.9 10*3/uL (ref 4.0–10.5)
nRBC: 0 % (ref 0.0–0.2)

## 2020-09-19 LAB — TSH: TSH: 1.289 u[IU]/mL (ref 0.350–4.500)

## 2020-09-19 LAB — LIPASE, BLOOD: Lipase: 33 U/L (ref 11–51)

## 2020-09-19 MED ORDER — AMLODIPINE BESYLATE 5 MG PO TABS
5.0000 mg | ORAL_TABLET | Freq: Once | ORAL | Status: AC
  Administered 2020-09-19: 5 mg via ORAL
  Filled 2020-09-19: qty 1

## 2020-09-19 NOTE — ED Triage Notes (Signed)
Emergency Medicine Provider Triage Evaluation Note  Liboria ADALEI NOVELL , a 55 y.o. female  was evaluated in triage.  Pt complains of fatigue, blood in her stool.  She went to [facility] for this already.   Physical Exam  BP (!) 178/111 (BP Location: Right Arm)   Pulse 83   Temp 98.2 F (36.8 C) (Oral)   Resp 18   Ht 5\' 4"  (1.626 m)   Wt 91.2 kg   SpO2 98%   BMI 34.50 kg/m  Patient is awake and alert and generally well appearing.   No acute distress. Respirations even and unlabored.   Medical Decision Making  Medically screening exam initiated at 12:24 PM.  Appropriate orders placed.  Cherese A Probert was informed that the remainder of the evaluation will be completed by another provider, this initial triage assessment does not replace that evaluation, and the importance of remaining in the ED until their evaluation is complete.  Clinical Impression  Fatigue, blood in stools.    Lorin Glass, [place] 09/19/20 1226

## 2020-09-19 NOTE — ED Triage Notes (Signed)
Pt has multiple complaints- c/o bloody stool since 3/14, went to [facility] ED last week and was discharged home- has GI appt on 4/18. Pt c/o hematuria last Wednesday, but has since resolved. Pt denies dysuria. Pt c/o weakness and headache

## 2020-09-19 NOTE — ED Provider Notes (Signed)
Assumed at 1530. Patient here for evaluation of fatigue, generalized weakness, blood in her stool and urine. UA is pending at time of signout.  UA not consistent with UTI, negative for blood at this time. Discussed with patient findings of studies. TSH pending at time of discharge. Discussed importance of PCP follow-up for further evaluation of her symptoms.   Quintella Reichert, MD 09/19/20 [phone]
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
## 2020-09-20 ENCOUNTER — Other Ambulatory Visit: Payer: Self-pay | Admitting: Family Medicine

## 2020-09-20 DIAGNOSIS — R1030 Lower abdominal pain, unspecified: Secondary | ICD-10-CM

## 2020-09-29 ENCOUNTER — Other Ambulatory Visit: Payer: Self-pay | Admitting: Family Medicine

## 2020-09-29 DIAGNOSIS — L659 Nonscarring hair loss, unspecified: Secondary | ICD-10-CM

## 2020-09-29 DIAGNOSIS — K644 Residual hemorrhoidal skin tags: Secondary | ICD-10-CM

## 2020-10-02 ENCOUNTER — Ambulatory Visit: Payer: Federal, State, Local not specified - PPO | Admitting: Physician Assistant

## 2020-10-02 ENCOUNTER — Encounter: Payer: Self-pay | Admitting: Physician Assistant

## 2020-10-02 VITALS — BP 154/86 | HR 86 | Ht 64.0 in | Wt 210.5 lb

## 2020-10-02 DIAGNOSIS — K625 Hemorrhage of anus and rectum: Secondary | ICD-10-CM

## 2020-10-02 DIAGNOSIS — R1084 Generalized abdominal pain: Secondary | ICD-10-CM | POA: Diagnosis not present

## 2020-10-02 MED ORDER — CLENPIQ 10-3.5-12 MG-GM -GM/160ML PO SOLN: 1.0000 | ORAL | 0 refills | Status: AC

## 2020-10-02 NOTE — Patient Instructions (Signed)
If you are age 57 or older, your body mass index should be between 23-30. Your Body mass index is 36.13 kg/m. If this is out of the aforementioned range listed, please consider follow up with your Primary Care Provider.  If you are age 52 or younger, your body mass index should be between 19-25. Your Body mass index is 36.13 kg/m. If this is out of the aformentioned range listed, please consider follow up with your Primary Care Provider.   Thank you for trusting me with your gastrointestinal care!    Jennifier Panther Burn, PA-C
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
## 2020-10-10 NOTE — Progress Notes (Signed)
Agree with the assessment and plan as outlined by Jennifer Lemmon, PA-C. ? ?Fadia Marlar, DO, FACG ? ?

## 2020-10-25 ENCOUNTER — Other Ambulatory Visit: Payer: Self-pay | Admitting: Family Medicine

## 2020-10-25 DIAGNOSIS — R1030 Lower abdominal pain, unspecified: Secondary | ICD-10-CM

## 2020-10-30 ENCOUNTER — Encounter: Payer: Self-pay | Admitting: Gastroenterology

## 2020-11-05 ENCOUNTER — Other Ambulatory Visit: Payer: Self-pay | Admitting: Family Medicine

## 2020-11-05 DIAGNOSIS — R1030 Lower abdominal pain, unspecified: Secondary | ICD-10-CM

## 2020-11-06 ENCOUNTER — Telehealth: Payer: Self-pay | Admitting: Gastroenterology

## 2020-11-06 NOTE — Telephone Encounter (Signed)
Good afternoon Dr. Bryan Lemma, patient called stating they do not have a care giver to come with them to their procedure so they rescheduled from 11/07/20 to 11/28/20.

## 2020-11-07 ENCOUNTER — Other Ambulatory Visit: Payer: Federal, State, Local not specified - PPO | Admitting: Gastroenterology

## 2020-11-26 ENCOUNTER — Other Ambulatory Visit: Payer: Self-pay | Admitting: Family Medicine

## 2020-11-26 DIAGNOSIS — R1030 Lower abdominal pain, unspecified: Secondary | ICD-10-CM

## 2020-11-27 ENCOUNTER — Telehealth: Payer: Self-pay | Admitting: Gastroenterology

## 2020-11-27 NOTE — Telephone Encounter (Signed)
Hey Dr. Bryan Lemma,   Patient called in to cancel procedure 11/28/2020. Do not have a driver. Rescheduled for 12/26/2020.

## 2020-11-28 ENCOUNTER — Encounter: Payer: Federal, State, Local not specified - PPO | Admitting: Gastroenterology

## 2020-12-11 ENCOUNTER — Other Ambulatory Visit: Payer: Self-pay | Admitting: Family Medicine

## 2020-12-11 DIAGNOSIS — R1030 Lower abdominal pain, unspecified: Secondary | ICD-10-CM

## 2020-12-26 ENCOUNTER — Encounter: Payer: Federal, State, Local not specified - PPO | Admitting: Gastroenterology

## 2020-12-28 ENCOUNTER — Other Ambulatory Visit: Payer: Self-pay | Admitting: Family Medicine

## 2020-12-28 DIAGNOSIS — L659 Nonscarring hair loss, unspecified: Secondary | ICD-10-CM

## 2020-12-31 ENCOUNTER — Other Ambulatory Visit: Payer: Self-pay | Admitting: Family Medicine

## 2020-12-31 DIAGNOSIS — R1030 Lower abdominal pain, unspecified: Secondary | ICD-10-CM

## 2021-01-09 ENCOUNTER — Telehealth: Payer: Self-pay | Admitting: Gastroenterology

## 2021-01-09 ENCOUNTER — Encounter: Payer: Federal, State, Local not specified - PPO | Admitting: Gastroenterology

## 2021-01-09 NOTE — Telephone Encounter (Signed)
Called patient at 3:19pm patients mailbox full could not leave a message.

## 2021-01-17 ENCOUNTER — Other Ambulatory Visit: Payer: Self-pay | Admitting: Family Medicine

## 2021-01-17 DIAGNOSIS — R1030 Lower abdominal pain, unspecified: Secondary | ICD-10-CM

## 2021-01-22 ENCOUNTER — Other Ambulatory Visit: Payer: Self-pay | Admitting: Family Medicine

## 2021-01-23 ENCOUNTER — Other Ambulatory Visit: Payer: Self-pay | Admitting: Family Medicine

## 2021-01-29 ENCOUNTER — Ambulatory Visit: Payer: Federal, State, Local not specified - PPO | Admitting: Family Medicine

## 2021-01-29 ENCOUNTER — Encounter: Payer: Self-pay | Admitting: Family Medicine

## 2021-01-29 ENCOUNTER — Other Ambulatory Visit: Payer: Self-pay

## 2021-01-29 ENCOUNTER — Other Ambulatory Visit: Payer: Self-pay | Admitting: Family Medicine

## 2021-01-29 VITALS — BP 120/82 | HR 79 | Temp 98.3°F | Ht 64.0 in | Wt 195.5 lb

## 2021-01-29 DIAGNOSIS — K644 Residual hemorrhoidal skin tags: Secondary | ICD-10-CM

## 2021-01-29 DIAGNOSIS — R1030 Lower abdominal pain, unspecified: Secondary | ICD-10-CM | POA: Diagnosis not present

## 2021-01-29 DIAGNOSIS — K645 Perianal venous thrombosis: Secondary | ICD-10-CM | POA: Diagnosis not present

## 2021-01-29 MED ORDER — HYDROCORTISONE 2.5 % EX CREA: TOPICAL_CREAM | Freq: Two times a day (BID) | CUTANEOUS | 0 refills | Status: DC

## 2021-01-29 MED ORDER — DICYCLOMINE HCL 10 MG PO CAPS: 10.0000 mg | ORAL_CAPSULE | Freq: Three times a day (TID) | ORAL | 0 refills | Status: DC
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
## 2021-01-29 NOTE — Patient Instructions (Signed)
Stay hydrated.  Try the cream.   If things don't get better, we need to get the clot out. You will need to see me or a Psychologist, sport and exercise.   Let us know if you need anything.

## 2021-02-20 ENCOUNTER — Telehealth: Payer: Self-pay | Admitting: *Deleted

## 2021-02-20 NOTE — Telephone Encounter (Signed)
Patient called and stated that she had elevated blood pressure for a few days and and she his concerned and cannot get it down.  She has no chest pain, no SOB, but is having a bad/severe headache.  She has been taking her medications. (Pt sounded very anxious on the phone).   Blood pressure last week Wed 181/109, Thurs 201/126, and Sat 191/112 was some of her numbers.  Advised pt to go to ER or urgent care to be evaluated, she declined.  Tried to get her appt for tomorrow, she declined stating that she had to go to work.  She would like for [place] to send message to Dr. Nani Ravens.  Advised patient that if she has any chest pains, SOB, or worsening HA to go to ER immediately.

## 2021-02-21 NOTE — Telephone Encounter (Signed)
Called left message to call back 

## 2021-02-21 NOTE — Telephone Encounter (Signed)
Take 2 tabs of Coreg daily and the rest of her meds routinely, f/u Fri or early next week. Ty.

## 2021-02-21 NOTE — Telephone Encounter (Signed)
Called the patient left message to call back 

## 2021-02-23 ENCOUNTER — Encounter: Payer: Self-pay | Admitting: Family Medicine

## 2021-02-23 ENCOUNTER — Ambulatory Visit (INDEPENDENT_AMBULATORY_CARE_PROVIDER_SITE_OTHER): Payer: Federal, State, Local not specified - PPO | Admitting: Family Medicine

## 2021-02-23 ENCOUNTER — Other Ambulatory Visit: Payer: Self-pay

## 2021-02-23 VITALS — BP 154/98 | HR 66 | Temp 98.3°F | Ht 64.0 in | Wt 200.4 lb

## 2021-02-23 DIAGNOSIS — I10 Essential (primary) hypertension: Secondary | ICD-10-CM

## 2021-02-23 DIAGNOSIS — Z0000 Encounter for general adult medical examination without abnormal findings: Secondary | ICD-10-CM | POA: Diagnosis not present

## 2021-02-23 MED ORDER — CARVEDILOL 25 MG PO TABS: 25.0000 mg | ORAL_TABLET | Freq: Two times a day (BID) | ORAL | 3 refills | Status: DC

## 2021-02-23 MED ORDER — OLMESARTAN MEDOXOMIL 20 MG PO TABS: 20.0000 mg | ORAL_TABLET | Freq: Every day | ORAL | 2 refills | Status: DC
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
## 2021-02-23 NOTE — Patient Instructions (Addendum)
Give [place] 2-3 business days to get the results of your labs back.   Keep the diet clean and stay active.  Check your blood pressures 2-3 times per week, alternating the time of day you check it. If it is high, considering waiting 1-2 minutes and rechecking. If it gets higher, your anxiety is likely creeping up and we should avoid rechecking.   I recommend getting the flu shot in mid October. This suggestion would change if the CDC comes out with a different recommendation.   The new Shingrix vaccine (for shingles) is a 2 shot series. It can make people feel low energy, achy and almost like they have the flu for 48 hours after injection. Please plan accordingly when deciding on when to get this shot. Call our office for a nurse visit appointment to get this. The second shot of the series is less severe regarding the side effects, but it still lasts 48 hours.   Let us know if you need anything.

## 2021-03-02 ENCOUNTER — Other Ambulatory Visit (INDEPENDENT_AMBULATORY_CARE_PROVIDER_SITE_OTHER): Payer: Federal, State, Local not specified - PPO

## 2021-03-02 ENCOUNTER — Other Ambulatory Visit: Payer: Self-pay

## 2021-03-02 DIAGNOSIS — Z0000 Encounter for general adult medical examination without abnormal findings: Secondary | ICD-10-CM | POA: Diagnosis not present

## 2021-03-02 LAB — LIPID PANEL
Cholesterol: 160 mg/dL (ref 0–200)
HDL: 42.4 mg/dL (ref >39.00)
LDL Cholesterol: 99 mg/dL (ref 0–99)
NonHDL: 117.57
Total CHOL/HDL Ratio: 4
Triglycerides: 93 mg/dL (ref 0.0–149.0)
VLDL: 18.6 mg/dL (ref 0.0–40.0)

## 2021-03-02 LAB — CBC
HCT: 36.6 % (ref 36.0–46.0)
Hemoglobin: 12.1 g/dL (ref 12.0–15.0)
MCHC: 32.9 g/dL (ref 30.0–36.0)
MCV: 88.4 fl (ref 78.0–100.0)
Platelets: 278 10*3/uL (ref 150.0–400.0)
RBC: 4.14 Mil/uL (ref 3.87–5.11)
RDW: 13 % (ref 11.5–15.5)
WBC: 4.8 10*3/uL (ref 4.0–10.5)

## 2021-03-02 LAB — COMPREHENSIVE METABOLIC PANEL
ALT: 8 U/L (ref 0–35)
AST: 11 U/L (ref 0–37)
Albumin: 4 g/dL (ref 3.5–5.2)
Alkaline Phosphatase: 85 U/L (ref 39–117)
BUN: 10 mg/dL (ref 6–23)
CO2: 26 mEq/L (ref 19–32)
Calcium: 9.2 mg/dL (ref 8.4–10.5)
Chloride: 103 mEq/L (ref 96–112)
Creatinine, Ser: 0.86 mg/dL (ref 0.40–1.20)
GFR: 75.23 mL/min (ref >60.00)
Glucose, Bld: 66 mg/dL — ABNORMAL LOW (ref 70–99)
Potassium: 3.9 mEq/L (ref 3.5–5.1)
Sodium: 139 mEq/L (ref 135–145)
Total Bilirubin: 0.4 mg/dL (ref 0.2–1.2)
Total Protein: 6.6 g/dL (ref 6.0–8.3)

## 2021-03-02 LAB — VITAMIN D 25 HYDROXY (VIT D DEFICIENCY, FRACTURES): VITD: <7 ng/mL — ABNORMAL LOW (ref 30.00–100.00)

## 2021-03-02 LAB — TSH: TSH: 0.85 u[IU]/mL (ref 0.35–5.50)

## 2021-03-03 ENCOUNTER — Other Ambulatory Visit: Payer: Self-pay | Admitting: Family Medicine

## 2021-03-03 MED ORDER — VITAMIN D (ERGOCALCIFEROL) 1.25 MG (50000 UNIT) PO CAPS: 50000.0000 [IU] | ORAL_CAPSULE | ORAL | 0 refills | Status: DC

## 2021-03-05 ENCOUNTER — Other Ambulatory Visit: Payer: Self-pay | Admitting: Family Medicine

## 2021-03-05 DIAGNOSIS — E559 Vitamin D deficiency, unspecified: Secondary | ICD-10-CM

## 2021-03-09 ENCOUNTER — Ambulatory Visit: Payer: Federal, State, Local not specified - PPO | Admitting: Family Medicine

## 2021-03-09 ENCOUNTER — Encounter: Payer: Self-pay | Admitting: Family Medicine

## 2021-03-09 ENCOUNTER — Other Ambulatory Visit: Payer: Self-pay

## 2021-03-09 VITALS — BP 124/82 | HR 71 | Temp 98.0°F | Ht 64.0 in | Wt 201.4 lb

## 2021-03-09 DIAGNOSIS — I10 Essential (primary) hypertension: Secondary | ICD-10-CM

## 2021-03-09 DIAGNOSIS — R519 Headache, unspecified: Secondary | ICD-10-CM

## 2021-03-09 MED ORDER — IBUPROFEN 800 MG PO TABS: 800.0000 mg | ORAL_TABLET | Freq: Three times a day (TID) | ORAL | 2 refills | Status: DC

## 2021-03-09 NOTE — Patient Instructions (Addendum)
Keep the diet clean and stay active.  Aim to do some physical exertion for 150 minutes per week. This is typically divided into 5 days per week, 30 minutes per day. The activity should be enough to get your heart rate up. Anything is better than nothing if you have time constraints.  Because your blood pressure is well-controlled, you no longer have to check your blood pressure at home anymore unless you wish. Some people check it twice daily every day and some people stop altogether. Either or anything in between is fine. Strong work!  Let us know if you need anything.  EXERCISES RANGE OF MOTION (ROM) AND STRETCHING EXERCISES  These exercises may help you when beginning to rehabilitate your issue. In order to successfully resolve your symptoms, you must improve your posture. These exercises are designed to help reduce the forward-head and rounded-shoulder posture which contributes to this condition. Your symptoms may resolve with or without further involvement from your physician, physical therapist or athletic trainer. While completing these exercises, remember:  Restoring tissue flexibility helps normal motion to return to the joints. This allows healthier, less painful movement and activity. An effective stretch should be held for at least 20 seconds, although you may need to begin with shorter hold times for comfort. A stretch should never be painful. You should only feel a gentle lengthening or release in the stretched tissue. Do not do any stretch or exercise that you cannot tolerate.  STRETCH- Axial Extensors Lie on your back on the floor. You may bend your knees for comfort. Place a rolled-up hand towel or dish towel, about 2 inches in diameter, under the part of your head that makes contact with the floor. Gently tuck your chin, as if trying to make a "double chin," until you feel a gentle stretch at the base of your head. Hold 15-20 seconds. Repeat 2-3 times. Complete this exercise 1  time per day.   STRETCH - Axial Extension  Stand or sit on a firm surface. Assume a good posture: chest up, shoulders drawn back, abdominal muscles slightly tense, knees unlocked (if standing) and feet hip width apart. Slowly retract your chin so your head slides back and your chin slightly lowers. Continue to look straight ahead. You should feel a gentle stretch in the back of your head. Be certain not to feel an aggressive stretch since this can cause headaches later. Hold for 15-20 seconds. Repeat 2-3 times. Complete this exercise 1 time per day.  STRETCH - Cervical Side Bend  Stand or sit on a firm surface. Assume a good posture: chest up, shoulders drawn back, abdominal muscles slightly tense, knees unlocked (if standing) and feet hip width apart. Without letting your nose or shoulders move, slowly tip your right / left ear to your shoulder until your feel a gentle stretch in the muscles on the opposite side of your neck. Hold 15-20 seconds. Repeat 2-3 times. Complete this exercise 1-2 times per day.  STRETCH - Cervical Rotators  Stand or sit on a firm surface. Assume a good posture: chest up, shoulders drawn back, abdominal muscles slightly tense, knees unlocked (if standing) and feet hip width apart. Keeping your eyes level with the ground, slowly turn your head until you feel a gentle stretch along the back and opposite side of your neck. Hold 15-20 seconds. Repeat 2-3 times. Complete this exercise 1-2 times per day.  RANGE OF MOTION - Neck Circles  Stand or sit on a firm surface. Assume a good  posture: chest up, shoulders drawn back, abdominal muscles slightly tense, knees unlocked (if standing) and feet hip width apart. Gently roll your head down and around from the back of one shoulder to the back of the other. The motion should never be forced or painful. Repeat the motion 10-20 times, or until you feel the neck muscles relax and loosen. Repeat 2-3 times. Complete the exercise  1-2 times per day. STRENGTHENING EXERCISES - Cervical Strain and Sprain These exercises may help you when beginning to rehabilitate your injury. They may resolve your symptoms with or without further involvement from your physician, physical therapist, or athletic trainer. While completing these exercises, remember:  Muscles can gain both the endurance and the strength needed for everyday activities through controlled exercises. Complete these exercises as instructed by your physician, physical therapist, or athletic trainer. Progress the resistance and repetitions only as guided. You may experience muscle soreness or fatigue, but the pain or discomfort you are trying to eliminate should never worsen during these exercises. If this pain does worsen, stop and make certain you are following the directions exactly. If the pain is still present after adjustments, discontinue the exercise until you can discuss the trouble with your clinician.  STRENGTH - Cervical Flexors, Isometric Face a wall, standing about 6 inches away. Place a small pillow, a ball about 6-8 inches in diameter, or a folded towel between your forehead and the wall. Slightly tuck your chin and gently push your forehead into the soft object. Push only with mild to moderate intensity, building up tension gradually. Keep your jaw and forehead relaxed. Hold 10 to 20 seconds. Keep your breathing relaxed. Release the tension slowly. Relax your neck muscles completely before you start the next repetition. Repeat 2-3 times. Complete this exercise 1 time per day.  STRENGTH- Cervical Lateral Flexors, Isometric  Stand about 6 inches away from a wall. Place a small pillow, a ball about 6-8 inches in diameter, or a folded towel between the side of your head and the wall. Slightly tuck your chin and gently tilt your head into the soft object. Push only with mild to moderate intensity, building up tension gradually. Keep your jaw and forehead  relaxed. Hold 10 to 20 seconds. Keep your breathing relaxed. Release the tension slowly. Relax your neck muscles completely before you start the next repetition. Repeat 2-3 times. Complete this exercise 1 time per day.  STRENGTH - Cervical Extensors, Isometric  Stand about 6 inches away from a wall. Place a small pillow, a ball about 6-8 inches in diameter, or a folded towel between the back of your head and the wall. Slightly tuck your chin and gently tilt your head back into the soft object. Push only with mild to moderate intensity, building up tension gradually. Keep your jaw and forehead relaxed. Hold 10 to 20 seconds. Keep your breathing relaxed. Release the tension slowly. Relax your neck muscles completely before you start the next repetition. Repeat 2-3 times. Complete this exercise 1 time per day.  POSTURE AND BODY MECHANICS CONSIDERATIONS Keeping correct posture when sitting, standing or completing your activities will reduce the stress put on different body tissues, allowing injured tissues a chance to heal and limiting painful experiences. The following are general guidelines for improved posture. Your physician or physical therapist will provide you with any instructions specific to your needs. While reading these guidelines, remember: The exercises prescribed by your provider will help you have the flexibility and strength to maintain correct postures. The correct  posture provides the optimal environment for your joints to work. All of your joints have less wear and tear when properly supported by a spine with good posture. This means you will experience a healthier, less painful body. Correct posture must be practiced with all of your activities, especially prolonged sitting and standing. Correct posture is as important when doing repetitive low-stress activities (typing) as it is when doing a single heavy-load activity (lifting).  PROLONGED STANDING WHILE SLIGHTLY LEANING  FORWARD When completing a task that requires you to lean forward while standing in one place for a long time, place either foot up on a stationary 2- to 4-inch high object to help maintain the best posture. When both feet are on the ground, the low back tends to lose its slight inward curve. If this curve flattens (or becomes too large), then the back and your other joints will experience too much stress, fatigue more quickly, and can cause pain.   RESTING POSITIONS Consider which positions are most painful for you when choosing a resting position. If you have pain with flexion-based activities (sitting, bending, stooping, squatting), choose a position that allows you to rest in a less flexed posture. You would want to avoid curling into a fetal position on your side. If your pain worsens with extension-based activities (prolonged standing, working overhead), avoid resting in an extended position such as sleeping on your stomach. Most people will find more comfort when they rest with their spine in a more neutral position, neither too rounded nor too arched. Lying on a non-sagging bed on your side with a pillow between your knees, or on your back with a pillow under your knees will often provide some relief. Keep in mind, being in any one position for a prolonged period of time, no matter how correct your posture, can still lead to stiffness.  WALKING Walk with an upright posture. Your ears, shoulders, and hips should all line up. OFFICE WORK When working at a desk, create an environment that supports good, upright posture. Without extra support, muscles fatigue and lead to excessive strain on joints and other tissues.  CHAIR: A chair should be able to slide under your desk when your back makes contact with the back of the chair. This allows you to work closely. The chair's height should allow your eyes to be level with the upper part of your monitor and your hands to be slightly lower than your  elbows. Body position: Your feet should make contact with the floor. If this is not possible, use a foot rest. Keep your ears over your shoulders. This will reduce stress on your neck and low back.
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
## 2021-04-02 ENCOUNTER — Other Ambulatory Visit: Payer: Self-pay | Admitting: Family Medicine

## 2021-04-02 DIAGNOSIS — L659 Nonscarring hair loss, unspecified: Secondary | ICD-10-CM

## 2021-04-05 DIAGNOSIS — Z79899 Other long term (current) drug therapy: Secondary | ICD-10-CM | POA: Diagnosis not present

## 2021-04-05 DIAGNOSIS — R5383 Other fatigue: Secondary | ICD-10-CM | POA: Diagnosis not present

## 2021-04-05 DIAGNOSIS — U071 COVID-19: Secondary | ICD-10-CM | POA: Diagnosis not present

## 2021-04-05 DIAGNOSIS — I10 Essential (primary) hypertension: Secondary | ICD-10-CM | POA: Diagnosis not present

## 2021-04-05 DIAGNOSIS — I517 Cardiomegaly: Secondary | ICD-10-CM | POA: Diagnosis not present

## 2021-04-05 DIAGNOSIS — Z885 Allergy status to narcotic agent status: Secondary | ICD-10-CM | POA: Diagnosis not present

## 2021-04-05 DIAGNOSIS — R638 Other symptoms and signs concerning food and fluid intake: Secondary | ICD-10-CM | POA: Diagnosis not present

## 2021-04-05 DIAGNOSIS — Z880 Allergy status to penicillin: Secondary | ICD-10-CM | POA: Diagnosis not present

## 2021-04-05 DIAGNOSIS — R509 Fever, unspecified: Secondary | ICD-10-CM | POA: Diagnosis not present

## 2021-04-05 DIAGNOSIS — R059 Cough, unspecified: Secondary | ICD-10-CM | POA: Diagnosis not present

## 2021-04-05 DIAGNOSIS — R0602 Shortness of breath: Secondary | ICD-10-CM | POA: Diagnosis not present

## 2021-04-06 ENCOUNTER — Ambulatory Visit: Payer: Federal, State, Local not specified - PPO | Admitting: Family

## 2021-05-20 ENCOUNTER — Other Ambulatory Visit: Payer: Self-pay | Admitting: Family Medicine

## 2021-05-20 DIAGNOSIS — I10 Essential (primary) hypertension: Secondary | ICD-10-CM

## 2021-05-21 ENCOUNTER — Other Ambulatory Visit: Payer: Self-pay | Admitting: Family Medicine

## 2021-06-01 ENCOUNTER — Other Ambulatory Visit: Payer: Self-pay | Admitting: Family Medicine

## 2021-06-01 ENCOUNTER — Other Ambulatory Visit: Payer: Federal, State, Local not specified - PPO

## 2021-06-01 ENCOUNTER — Other Ambulatory Visit (INDEPENDENT_AMBULATORY_CARE_PROVIDER_SITE_OTHER): Payer: Federal, State, Local not specified - PPO

## 2021-06-01 DIAGNOSIS — E559 Vitamin D deficiency, unspecified: Secondary | ICD-10-CM | POA: Diagnosis not present

## 2021-06-01 LAB — VITAMIN D 25 HYDROXY (VIT D DEFICIENCY, FRACTURES): VITD: 9.13 ng/mL — ABNORMAL LOW (ref 30.00–100.00)

## 2021-06-01 MED ORDER — VITAMIN D (ERGOCALCIFEROL) 1.25 MG (50000 UNIT) PO CAPS: 50000.0000 [IU] | ORAL_CAPSULE | ORAL | 0 refills | Status: DC

## 2021-07-18 DIAGNOSIS — L669 Cicatricial alopecia, unspecified: Secondary | ICD-10-CM | POA: Diagnosis not present

## 2021-08-07 ENCOUNTER — Encounter: Payer: Self-pay | Admitting: Family Medicine

## 2021-08-07 ENCOUNTER — Ambulatory Visit: Payer: Federal, State, Local not specified - PPO | Admitting: Family Medicine

## 2021-08-07 VITALS — BP 130/84 | HR 71 | Temp 97.9°F | Resp 16 | Ht 64.0 in | Wt 196.0 lb

## 2021-08-07 DIAGNOSIS — K529 Noninfective gastroenteritis and colitis, unspecified: Secondary | ICD-10-CM

## 2021-08-07 MED ORDER — DICYCLOMINE HCL 10 MG PO CAPS: ORAL_CAPSULE | ORAL | 0 refills | Status: DC
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
## 2021-08-07 NOTE — Patient Instructions (Addendum)
Stay hydrated.  Avoid food that triggers your pain.   Try to avoid antidiarrheals as this will usually need to flush out.   Let us know if you need anything.

## 2021-08-16 ENCOUNTER — Other Ambulatory Visit: Payer: Self-pay | Admitting: Family Medicine

## 2021-08-27 ENCOUNTER — Other Ambulatory Visit: Payer: Self-pay | Admitting: Family Medicine

## 2021-08-27 DIAGNOSIS — I10 Essential (primary) hypertension: Secondary | ICD-10-CM

## 2021-09-05 ENCOUNTER — Other Ambulatory Visit (INDEPENDENT_AMBULATORY_CARE_PROVIDER_SITE_OTHER): Payer: Federal, State, Local not specified - PPO

## 2021-09-05 DIAGNOSIS — R6889 Other general symptoms and signs: Secondary | ICD-10-CM | POA: Diagnosis not present

## 2021-09-05 DIAGNOSIS — E559 Vitamin D deficiency, unspecified: Secondary | ICD-10-CM | POA: Diagnosis not present

## 2021-09-05 NOTE — Addendum Note (Signed)
Addended by: Manuela Schwartz on: 09/05/2021 03:18 PM ? ? Modules accepted: Orders ? ?

## 2021-09-06 ENCOUNTER — Other Ambulatory Visit: Payer: Self-pay

## 2021-09-06 ENCOUNTER — Other Ambulatory Visit: Payer: Self-pay | Admitting: Family Medicine

## 2021-09-06 DIAGNOSIS — E559 Vitamin D deficiency, unspecified: Secondary | ICD-10-CM

## 2021-09-06 LAB — CBC
HCT: 38.5 % (ref 36.0–46.0)
Hemoglobin: 12.7 g/dL (ref 12.0–15.0)
MCHC: 33.1 g/dL (ref 30.0–36.0)
MCV: 89 fl (ref 78.0–100.0)
Platelets: 308 10*3/uL (ref 150.0–400.0)
RBC: 4.33 Mil/uL (ref 3.87–5.11)
RDW: 13 % (ref 11.5–15.5)
WBC: 5.3 10*3/uL (ref 4.0–10.5)

## 2021-09-06 LAB — VITAMIN D 25 HYDROXY (VIT D DEFICIENCY, FRACTURES): VITD: 28.62 ng/mL — ABNORMAL LOW (ref 30.00–100.00)

## 2021-09-07 ENCOUNTER — Other Ambulatory Visit: Payer: Self-pay | Admitting: Family Medicine

## 2021-09-15 ENCOUNTER — Other Ambulatory Visit: Payer: Self-pay | Admitting: Family Medicine

## 2021-09-18 DIAGNOSIS — L669 Cicatricial alopecia, unspecified: Secondary | ICD-10-CM | POA: Diagnosis not present

## 2021-09-24 ENCOUNTER — Encounter: Payer: Self-pay | Admitting: Family Medicine

## 2021-09-24 ENCOUNTER — Ambulatory Visit: Payer: Federal, State, Local not specified - PPO | Admitting: Family Medicine

## 2021-09-24 VITALS — BP 138/84 | HR 69 | Temp 98.0°F | Resp 16 | Ht 64.0 in | Wt 192.8 lb

## 2021-09-24 DIAGNOSIS — J0100 Acute maxillary sinusitis, unspecified: Secondary | ICD-10-CM

## 2021-09-24 MED ORDER — DOXYCYCLINE HYCLATE 100 MG PO TABS: 100.0000 mg | ORAL_TABLET | Freq: Two times a day (BID) | ORAL | 0 refills | Status: DC

## 2021-09-24 MED ORDER — PREDNISONE 20 MG PO TABS: 40.0000 mg | ORAL_TABLET | Freq: Every day | ORAL | 0 refills | Status: DC
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
## 2021-09-24 NOTE — Patient Instructions (Signed)
Continue to push fluids, practice good hand hygiene, and cover your mouth if you cough. ? ?If you start having fevers, shaking or shortness of breath, seek immediate care. ? ?OK to take Tylenol 1000 mg (2 extra strength tabs) or 975 mg (3 regular strength tabs) every 6 hours as needed. ? ?Let us know if you need anything. ?

## 2021-09-28 ENCOUNTER — Telehealth: Payer: Self-pay | Admitting: Family Medicine

## 2021-09-28 MED ORDER — AZITHROMYCIN 250 MG PO TABS: ORAL_TABLET | ORAL | 0 refills | Status: DC

## 2021-09-28 NOTE — Telephone Encounter (Signed)
Patient informed sent in. 

## 2021-09-28 NOTE — Telephone Encounter (Signed)
Patient states she still does not feel better from her 04/10 OV, and she is out of the prednisone. She would like a call back to know what else she can try. Please advise.  ?

## 2021-10-01 ENCOUNTER — Encounter: Payer: Self-pay | Admitting: Family Medicine

## 2021-10-01 ENCOUNTER — Ambulatory Visit: Payer: Federal, State, Local not specified - PPO | Admitting: Family Medicine

## 2021-10-01 VITALS — BP 148/90 | HR 83 | Temp 98.3°F | Ht 64.0 in | Wt 192.1 lb

## 2021-10-01 DIAGNOSIS — R030 Elevated blood-pressure reading, without diagnosis of hypertension: Secondary | ICD-10-CM | POA: Diagnosis not present

## 2021-10-01 DIAGNOSIS — G43909 Migraine, unspecified, not intractable, without status migrainosus: Secondary | ICD-10-CM

## 2021-10-01 MED ORDER — KETOROLAC TROMETHAMINE 60 MG/2ML IM SOLN
60.0000 mg | Freq: Once | INTRAMUSCULAR | Status: AC
  Administered 2021-10-01: 60 mg via INTRAMUSCULAR

## 2021-10-01 NOTE — Patient Instructions (Addendum)
Ice/cold pack over area for 10-15 min twice daily. ? ?Heat (pad or rice pillow in microwave) over affected area, 10-15 minutes twice daily.  ? ?OK to take Tylenol 1000 mg (2 extra strength tabs) or 975 mg (3 regular strength tabs) every 6 hours as needed. ? ?Monitor your blood pressure at home over the next few weeks and let me know if still elevated. ? ?Stay hydrated.  ? ?Let us know if you need anything. ? ? ?Mid-Back Strain Rehab ?It is normal to feel mild stretching, pulling, tightness, or discomfort as you do these exercises, but you should stop right away if you feel sudden pain or your pain gets worse.  ? ?Stretching and range of motion exercises ?This exercise warms up your muscles and joints and improves the movement and flexibility of your back and shoulders. This exercise also help to relieve pain. ?Exercise A: Chest and spine stretch ? ?  ?Lie down on your back on a firm surface. ?Roll a towel or a small blanket so it is about 4 inches (10 cm) in diameter. ?Put the towel lengthwise under the middle of your back so it is under your spine, but not under your shoulder blades. ?To increase the stretch, you may put your hands behind your head and let your elbows fall to your sides. ?Hold for 30 seconds. ?Repeat exercise 2 times. Complete this exercise 3 times per week. ? ?Strengthening exercises ?These exercises build strength and endurance in your back and your shoulder blade muscles. Endurance is the ability to use your muscles for a long time, even after they get tired. ?Exercise B: Alternating arm and leg raises ? ?  ?Get on your hands and knees on a firm surface. If you are on a hard floor, you may want to use padding to cushion your knees, such as an exercise mat. ?Line up your arms and legs. Your hands should be below your shoulders, and your knees should be below your hips. ?Lift your left leg behind you. At the same time, raise your right arm and straighten it in front of you. ?Do not lift your leg  higher than your hip. ?Do not lift your arm higher than your shoulder. ?Keep your abdominal and back muscles tight. ?Keep your hips facing the ground. ?Do not arch your back. ?Keep your balance carefully, and do not hold your breath. ?Hold for 3 seconds. ?Slowly return to the starting position and repeat with your right leg and your left arm. ?Repeat 2 times. Complete this exercise 3 times per week. ?Exercise C: Straight arm rows (shoulder extension)  ? ?  ?Stand with your feet shoulder width apart. ?Secure an exercise band to a stable object in front of you so the band is at or above shoulder height. ?Hold one end of the exercise band in each hand. ?Straighten your elbows and lift your hands up to shoulder height. ?Step back, away from the secured end of the exercise band, until the band stretches. ?Squeeze your shoulder blades together and pull your hands down to the sides of your thighs. Stop when your hands are straight down by your sides. Do not let your hands go behind your body. ?Hold for 3 seconds. ?Slowly return to the starting position. ?Repeat 2 times. Complete this exercise 3 times per week. ?Exercise D: Shoulder external rotation, prone ?Lie on your abdomen on a firm bed so your left / right forearm hangs over the edge of the bed and your upper arm is on  the bed, straight out from your body. ?Your elbow should be bent. ?Your palm should be facing your feet. ?If instructed, hold a 5 lb weight in your hand. ?Squeeze your shoulder blade toward the middle of your back. Do not let your shoulder lift toward your ear. ?Keep your elbow bent in an "L" shape (90 degrees) while you slowly move your forearm up toward the ceiling. Move your forearm up to the height of the bed, toward your head. ?Your upper arm should not move. ?At the top of the movement, your palm should face the floor. ?Hold for 3 seconds. ?Slowly return to the starting position and relax your muscles. ?Repeat 3 times. Complete this exercise 3  times per week. ?Exercise E: Scapular retraction and external rotation, rowing ? ?  ?Sit in a stable chair without armrests, or stand. ?Secure an exercise band to a stable object in front of you so it is at shoulder height. ?Hold one end of the exercise band in each hand. ?Bring your arms out straight in front of you. ?Step back, away from the secured end of the exercise band, until the band stretches. ?Pull the band backward. As you do this, bend your elbows and squeeze your shoulder blades together, but avoid letting the rest of your body move. Do not let your shoulders lift up toward your ears. ?Stop when your elbows are at your sides or slightly behind your body. ?Hold for 5 seconds. ?Slowly straighten your arms to return to the starting position. ?Repeat 2 times. Complete this exercise 3 times per week. ?Posture and body mechanics ? ?  ?Body mechanics refers to the movements and positions of your body while you do your daily activities. Posture is part of body mechanics. Good posture and healthy body mechanics can help to relieve stress in your body's tissues and joints. Good posture means that your spine is in its natural S-curve position (your spine is neutral), your shoulders are pulled back slightly, and your head is not tipped forward. The following are general guidelines for applying improved posture and body mechanics to your everyday activities. ?Standing ? ?  ?When standing, keep your spine neutral and your feet about hip-width apart. Keep a slight bend in your knees. Your ears, shoulders, and hips should line up. ?When you do a task in which you lean forward while standing in one place for a long time, place one foot up on a stable object that is 2-4 inches (5-10 cm) high, such as a footstool. This helps keep your spine neutral. ?Sitting ? ?  ?When sitting, keep your spine neutral and keep your feet flat on the floor. Use a footrest, if necessary, and keep your thighs parallel to the floor. Avoid  rounding your shoulders, and avoid tilting your head forward. ?When working at a desk or a computer, keep your desk at a height where your hands are slightly lower than your elbows. Slide your chair under your desk so you are close enough to maintain good posture. ?When working at a computer, place your monitor at a height where you are looking straight ahead and you do not have to tilt your head forward or downward to look at the screen. ?Resting ? ?  ?When lying down and resting, avoid positions that are most painful for you. ?If you have pain with activities such as sitting, bending, stooping, or squatting (flexion-based activities), lie in a position in which your body does not bend very much. For example, avoid curling up  on your side with your arms and knees near your chest (fetal position). ?If you have pain with activities such as standing for a long time or reaching with your arms (extension-based activities), lie with your spine in a neutral position and bend your knees slightly. Try the following positions: ?Lying on your side with a pillow between your knees. ?Lying on your back with a pillow under your knees. ?  ?Lifting ? ?  ?When lifting objects, keep your feet at least shoulder-width apart and tighten your abdominal muscles. ?Bend your knees and hips and keep your spine neutral. It is important to lift using the strength of your legs, not your back. Do not lock your knees straight out. ?Always ask for help to lift heavy or awkward objects. ?Make sure you discuss any questions you have with your health care provider. ? ?
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
## 2021-10-04 ENCOUNTER — Encounter: Payer: Self-pay | Admitting: Family Medicine

## 2021-10-04 ENCOUNTER — Ambulatory Visit: Payer: Federal, State, Local not specified - PPO | Admitting: Family Medicine

## 2021-10-04 VITALS — BP 156/97 | HR 65 | Temp 98.2°F | Ht 64.0 in | Wt 194.4 lb

## 2021-10-04 DIAGNOSIS — T7840XA Allergy, unspecified, initial encounter: Secondary | ICD-10-CM | POA: Diagnosis not present

## 2021-10-04 MED ORDER — PREDNISONE 20 MG PO TABS: 20.0000 mg | ORAL_TABLET | Freq: Every day | ORAL | 0 refills | Status: AC

## 2021-10-04 MED ORDER — FAMOTIDINE 20 MG PO TABS: 20.0000 mg | ORAL_TABLET | Freq: Two times a day (BID) | ORAL | 0 refills | Status: DC

## 2021-10-04 MED ORDER — EPINEPHRINE 0.3 MG/0.3ML IJ SOAJ: 0.3000 mg | INTRAMUSCULAR | 1 refills | Status: AC | PRN

## 2021-10-04 MED ORDER — CETIRIZINE HCL 10 MG PO TABS: 10.0000 mg | ORAL_TABLET | Freq: Every day | ORAL | 11 refills | Status: DC

## 2021-10-04 NOTE — Progress Notes (Signed)
? ?Acute Office Visit ? ?Subjective:  ? ?  ?Patient ID: Hannah Conley, female    DOB: 11/25/1965, 56 y.o.   MRN: [id number] ? ?CC: allergic reaction  ? ? ?HPI ?Patient is in today for allergic reaction/rash. ? ?Patient reports that yesterday she noticed a rash/hives to her left upper/lower arm, right upper arm, right upper back.  States it is warm, itchy, painful.  She does not recall any changes to medications, foods, detergents, soaps, etc.  She has no other rashes, cough, dyspnea, throat swelling, difficulty swallowing/breathing.  States her throat does feel a little bit scratchy.  She did get some relief with Benadryl and states the rash does look better today but is still uncomfortable.  She cannot take much Benadryl during the day because it makes her too drowsy. ? ?She is unsure of potential causes but does state that on 09/28/2021 she did start azithromycin for a sinus infection and few days later noticed a very mild rash to the right arm, so she stopped taking the antibiotic.  That rash resolved and then she saw PCP on 10/01/2021 and was given a Toradol injection for migraine.  States she has had Toradol injections before with no reaction so unsure if this is the cause. ? ? ?ROS ?All review of systems negative except what is listed in the HPI ? ? ?   ?Objective:  ?  ?BP (!) 156/97   Pulse 65   Temp 98.2 ?F (36.8 ?C)   Ht '5\' 4"'$  (1.626 m)   Wt 194 lb 6.4 oz (88.2 kg)   BMI 33.37 kg/m?  ? ? ?Physical Exam ?Vitals reviewed.  ?Constitutional:   ?   Appearance: Normal appearance.  ?Cardiovascular:  ?   Rate and Rhythm: Normal rate and regular rhythm.  ?Pulmonary:  ?   Effort: Pulmonary effort is normal.  ?   Breath sounds: Normal breath sounds.  ?Skin: ?   General: Skin is warm and dry.  ?   Findings: Rash present.  ?   Comments: Hives/wheals to left arm, right upper arm, right upper back  ?Neurological:  ?   General: No focal deficit present.  ?   Mental Status: She is alert and oriented to person, place,  and time. Mental status is at baseline.  ?Psychiatric:     ?   Mood and Affect: Mood normal.     ?   Behavior: Behavior normal.     ?   Thought Content: Thought content normal.     ?   Judgment: Judgment normal.  ? ? ?No results found for any visits on 10/04/21. ? ? ?   ?Assessment & Plan:  ? ?1. Allergic reaction, initial encounter ?Unsure of trigger. ?Let's try 5 days of prednisone (lower dose since you just took some a few weeks ago), Zyrtec and Pepcid for the next several days. You can try topical creams/lotions for comfort. Sending in an epi-pen to have in case of emergency. If you develop any worsening symptoms, go to the ED. Consider allergist referral if you continue getting rashes like this with unknown causes.  ? ?- predniSONE (DELTASONE) 20 MG tablet; Take 1 tablet (20 mg total) by mouth daily with breakfast for 5 days.  Dispense: 5 tablet; Refill: 0 ?- cetirizine (ZYRTEC) 10 MG tablet; Take 1 tablet (10 mg total) by mouth daily.  Dispense: 30 tablet; Refill: 11 ?- famotidine (PEPCID) 20 MG tablet; Take 1 tablet (20 mg total) by mouth 2 (two) times daily.  Dispense: 30 tablet; Refill: 0 ?- EPINEPHrine 0.3 mg/0.3 mL IJ SOAJ injection; Inject 0.3 mg into the muscle as needed for anaphylaxis.  Dispense: 1 each; Refill: 1 ? ? ?BP was elevated today, but she is very uncomfortable.  Recommend monitoring at home and following up if remaining high. ? ? ?Return if symptoms worsen or fail to improve. ? ?Terrilyn Saver, NP ? ? ?

## 2021-10-04 NOTE — Patient Instructions (Signed)
Unsure of trigger. ?Let's try 5 days of prednisone (lower dose since you just took some a few weeks ago), Zyrtec and Pepcid for the next several days. You can topical creams/lotions for comfort. Sending in an epi-pen to have in case of emergency. If you develop any worsening symptoms, go to the ED. Consider allergist referral if you continue getting rashes like this with unknown causes.  ?

## 2021-10-11 ENCOUNTER — Other Ambulatory Visit: Payer: Self-pay | Admitting: Family Medicine

## 2021-10-11 DIAGNOSIS — T7840XA Allergy, unspecified, initial encounter: Secondary | ICD-10-CM

## 2021-11-01 ENCOUNTER — Other Ambulatory Visit: Payer: Self-pay | Admitting: Family Medicine

## 2021-11-19 DIAGNOSIS — L669 Cicatricial alopecia, unspecified: Secondary | ICD-10-CM | POA: Diagnosis not present

## 2021-12-03 ENCOUNTER — Other Ambulatory Visit: Payer: Self-pay | Admitting: Family Medicine

## 2021-12-03 DIAGNOSIS — I10 Essential (primary) hypertension: Secondary | ICD-10-CM

## 2021-12-05 ENCOUNTER — Other Ambulatory Visit (INDEPENDENT_AMBULATORY_CARE_PROVIDER_SITE_OTHER): Payer: Federal, State, Local not specified - PPO

## 2021-12-05 ENCOUNTER — Other Ambulatory Visit: Payer: Self-pay | Admitting: *Deleted

## 2021-12-05 DIAGNOSIS — E559 Vitamin D deficiency, unspecified: Secondary | ICD-10-CM | POA: Diagnosis not present

## 2021-12-11 ENCOUNTER — Ambulatory Visit (INDEPENDENT_AMBULATORY_CARE_PROVIDER_SITE_OTHER): Payer: Federal, State, Local not specified - PPO | Admitting: Family

## 2021-12-11 VITALS — BP 176/97 | HR 90 | Temp 98.9°F | Resp 16 | Wt 195.0 lb

## 2021-12-11 DIAGNOSIS — J069 Acute upper respiratory infection, unspecified: Secondary | ICD-10-CM

## 2021-12-11 DIAGNOSIS — R519 Headache, unspecified: Secondary | ICD-10-CM | POA: Diagnosis not present

## 2021-12-11 MED ORDER — KETOROLAC TROMETHAMINE 60 MG/2ML IM SOLN
60.0000 mg | Freq: Once | INTRAMUSCULAR | Status: AC
  Administered 2021-12-11: 60 mg via INTRAMUSCULAR

## 2021-12-11 NOTE — Assessment & Plan Note (Signed)
New. Home covid test was negative.  Pt is advised as follows:  Get plenty of rest and hydration. Start mucinex 600mg  twice daily. Use neti pot twice daily. Add flonase 2 sprays each nostril once daily.  Call if symptoms worsen or if symptoms do not improve. For pain you can use tylenol or motrin as needed.

## 2021-12-11 NOTE — Assessment & Plan Note (Signed)
Pt with persistent HA not relieved by tylenol or motrin. Also has associated noise sensitivity. Pt given Toradol 60mg  IM in the clinic today for headache relief.

## 2021-12-12 LAB — VITAMIN D 1,25 DIHYDROXY
Vitamin D 1, 25 (OH)2 Total: 59 pg/mL (ref 18–72)
Vitamin D2 1, 25 (OH)2: 49 pg/mL
Vitamin D3 1, 25 (OH)2: 10 pg/mL

## 2021-12-15 ENCOUNTER — Other Ambulatory Visit: Payer: Self-pay | Admitting: Family Medicine

## 2021-12-15 DIAGNOSIS — T7840XA Allergy, unspecified, initial encounter: Secondary | ICD-10-CM

## 2021-12-17 ENCOUNTER — Encounter: Payer: Self-pay | Admitting: Family Medicine

## 2021-12-17 ENCOUNTER — Ambulatory Visit: Payer: Federal, State, Local not specified - PPO | Admitting: Family Medicine

## 2021-12-17 VITALS — BP 138/78 | HR 68 | Temp 98.2°F | Ht 64.0 in | Wt 192.1 lb

## 2021-12-17 DIAGNOSIS — J329 Chronic sinusitis, unspecified: Secondary | ICD-10-CM

## 2021-12-17 DIAGNOSIS — L0292 Furuncle, unspecified: Secondary | ICD-10-CM | POA: Diagnosis not present

## 2021-12-17 MED ORDER — FLUTICASONE PROPIONATE 50 MCG/ACT NA SUSP: 2.0000 | Freq: Every day | NASAL | 6 refills | Status: AC

## 2021-12-17 MED ORDER — MONTELUKAST SODIUM 10 MG PO TABS: 10.0000 mg | ORAL_TABLET | Freq: Every day | ORAL | 3 refills | Status: AC

## 2021-12-17 MED ORDER — DOXYCYCLINE HYCLATE 100 MG PO TABS: 100.0000 mg | ORAL_TABLET | Freq: Two times a day (BID) | ORAL | 0 refills | Status: AC

## 2021-12-17 MED ORDER — FLUCONAZOLE 150 MG PO TABS: ORAL_TABLET | ORAL | 0 refills | Status: DC

## 2021-12-17 MED ORDER — LEVOCETIRIZINE DIHYDROCHLORIDE 5 MG PO TABS: 5.0000 mg | ORAL_TABLET | Freq: Every evening | ORAL | 5 refills | Status: AC

## 2021-12-17 NOTE — Patient Instructions (Signed)
Claritin (loratadine), Allegra (fexofenadine), Zyrtec (cetirizine) which is also equivalent to Xyzal (levocetirizine); these are listed in order from weakest to strongest. Generic, and therefore cheaper, options are in the parentheses.   Flonase (fluticasone); nasal spray that is over the counter. 2 sprays each nostril, once daily. Aim towards the same side eye when you spray.  There are available OTC, and the generic versions, which may be cheaper, are in parentheses. Show this to a pharmacist if you have trouble finding any of these items.  Take Flonase and Zyrtec during the spring. You may need to add montelukast/Singulair to these 2 during weather changes or high pollen counts.  Use Neosporin on ingrown hairs when they first arise to avoid progression.   Let us know if you need anything.
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
## 2021-12-24 ENCOUNTER — Other Ambulatory Visit: Payer: Self-pay | Admitting: Family Medicine

## 2021-12-24 DIAGNOSIS — T7840XA Allergy, unspecified, initial encounter: Secondary | ICD-10-CM

## 2022-01-08 ENCOUNTER — Other Ambulatory Visit: Payer: Self-pay | Admitting: Family Medicine

## 2022-01-08 DIAGNOSIS — T7840XA Allergy, unspecified, initial encounter: Secondary | ICD-10-CM

## 2022-01-23 DIAGNOSIS — L669 Cicatricial alopecia, unspecified: Secondary | ICD-10-CM | POA: Diagnosis not present

## 2022-02-25 ENCOUNTER — Encounter: Payer: Self-pay | Admitting: Family Medicine

## 2022-02-25 ENCOUNTER — Ambulatory Visit (INDEPENDENT_AMBULATORY_CARE_PROVIDER_SITE_OTHER): Payer: Federal, State, Local not specified - PPO | Admitting: Family Medicine

## 2022-02-25 VITALS — BP 134/84 | HR 86 | Temp 98.0°F | Ht 64.0 in | Wt 194.1 lb

## 2022-02-25 DIAGNOSIS — F5089 Other specified eating disorder: Secondary | ICD-10-CM | POA: Diagnosis not present

## 2022-02-25 DIAGNOSIS — Z0000 Encounter for general adult medical examination without abnormal findings: Secondary | ICD-10-CM

## 2022-02-25 DIAGNOSIS — Z1231 Encounter for screening mammogram for malignant neoplasm of breast: Secondary | ICD-10-CM

## 2022-02-25 NOTE — Patient Instructions (Addendum)
Give [place] 2-3 business days to get the results of your labs back.   Keep the diet clean and stay active.  I recommend getting the flu shot in mid October. This suggestion would change if the CDC comes out with a different recommendation.   The Shingrix vaccine (for shingles) is a 2 shot series spaced 2-6 months apart. It can make people feel low energy, achy and almost like they have the flu for 48 hours after injection. 1/5 people can have nausea and/or vomiting. Please plan accordingly when deciding on when to get this shot. Call our office for a nurse visit appointment to get this. The second shot of the series is less severe regarding the side effects, but it still lasts 48 hours.   Please get me a copy of your advanced directive form at your convenience.   Goal:  Consume 1600 calories daily, 400 of which (100 g) coming from protein. You will have to start reading labels.   Let us know if you need anything.
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
## 2022-02-27 ENCOUNTER — Other Ambulatory Visit: Payer: Federal, State, Local not specified - PPO

## 2022-03-27 DIAGNOSIS — L668 Other cicatricial alopecia: Secondary | ICD-10-CM | POA: Diagnosis not present

## 2022-04-29 ENCOUNTER — Other Ambulatory Visit: Payer: Self-pay | Admitting: Family Medicine

## 2022-04-29 DIAGNOSIS — R519 Headache, unspecified: Secondary | ICD-10-CM

## 2022-06-06 DIAGNOSIS — L720 Epidermal cyst: Secondary | ICD-10-CM | POA: Diagnosis not present

## 2022-06-06 DIAGNOSIS — L668 Other cicatricial alopecia: Secondary | ICD-10-CM | POA: Diagnosis not present

## 2022-07-21 DIAGNOSIS — Z79899 Other long term (current) drug therapy: Secondary | ICD-10-CM | POA: Diagnosis not present

## 2022-07-21 DIAGNOSIS — I10 Essential (primary) hypertension: Secondary | ICD-10-CM | POA: Diagnosis not present

## 2022-07-21 DIAGNOSIS — J329 Chronic sinusitis, unspecified: Secondary | ICD-10-CM | POA: Diagnosis not present

## 2022-07-21 DIAGNOSIS — R072 Precordial pain: Secondary | ICD-10-CM | POA: Diagnosis not present

## 2022-07-21 DIAGNOSIS — R079 Chest pain, unspecified: Secondary | ICD-10-CM | POA: Diagnosis not present

## 2022-07-21 DIAGNOSIS — N390 Urinary tract infection, site not specified: Secondary | ICD-10-CM | POA: Diagnosis not present

## 2022-07-21 DIAGNOSIS — R059 Cough, unspecified: Secondary | ICD-10-CM | POA: Diagnosis not present

## 2022-07-21 DIAGNOSIS — I517 Cardiomegaly: Secondary | ICD-10-CM | POA: Diagnosis not present

## 2022-07-21 DIAGNOSIS — R509 Fever, unspecified: Secondary | ICD-10-CM | POA: Diagnosis not present

## 2022-07-21 DIAGNOSIS — U071 COVID-19: Secondary | ICD-10-CM | POA: Diagnosis not present

## 2022-07-21 DIAGNOSIS — Z885 Allergy status to narcotic agent status: Secondary | ICD-10-CM | POA: Diagnosis not present

## 2022-07-21 DIAGNOSIS — J029 Acute pharyngitis, unspecified: Secondary | ICD-10-CM | POA: Diagnosis not present

## 2022-07-21 DIAGNOSIS — Z880 Allergy status to penicillin: Secondary | ICD-10-CM | POA: Diagnosis not present

## 2022-07-22 DIAGNOSIS — N390 Urinary tract infection, site not specified: Secondary | ICD-10-CM | POA: Diagnosis not present

## 2022-07-22 DIAGNOSIS — R509 Fever, unspecified: Secondary | ICD-10-CM | POA: Diagnosis not present

## 2022-07-22 DIAGNOSIS — J329 Chronic sinusitis, unspecified: Secondary | ICD-10-CM | POA: Diagnosis not present

## 2022-07-30 ENCOUNTER — Encounter: Payer: Self-pay | Admitting: Family Medicine

## 2022-07-30 ENCOUNTER — Ambulatory Visit: Payer: Federal, State, Local not specified - PPO | Admitting: Family Medicine

## 2022-07-30 VITALS — BP 138/86 | HR 80 | Temp 97.1°F | Ht 64.0 in | Wt 192.1 lb

## 2022-07-30 DIAGNOSIS — N3000 Acute cystitis without hematuria: Secondary | ICD-10-CM | POA: Diagnosis not present

## 2022-07-30 DIAGNOSIS — Z1231 Encounter for screening mammogram for malignant neoplasm of breast: Secondary | ICD-10-CM

## 2022-07-30 DIAGNOSIS — Z1211 Encounter for screening for malignant neoplasm of colon: Secondary | ICD-10-CM

## 2022-07-30 MED ORDER — SULFAMETHOXAZOLE-TRIMETHOPRIM 800-160 MG PO TABS: 1.0000 | ORAL_TABLET | Freq: Two times a day (BID) | ORAL | 0 refills | Status: AC
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
## 2022-07-30 NOTE — Patient Instructions (Signed)
Stay hydrated.  ° °Warning signs/symptoms: Uncontrollable nausea/vomiting, fevers, worsening symptoms despite treatment, confusion. ° °Let us know if you need anything. ° °

## 2022-08-05 ENCOUNTER — Encounter (HOSPITAL_BASED_OUTPATIENT_CLINIC_OR_DEPARTMENT_OTHER): Payer: Self-pay

## 2022-08-05 ENCOUNTER — Ambulatory Visit (HOSPITAL_BASED_OUTPATIENT_CLINIC_OR_DEPARTMENT_OTHER)
Admission: RE | Admit: 2022-08-05 | Discharge: 2022-08-05 | Disposition: A | Discharge status: Home / Self Care | Payer: Federal, State, Local not specified - PPO | Source: Ambulatory Visit | Attending: Family Medicine | Admitting: Family Medicine

## 2022-08-05 DIAGNOSIS — Z1231 Encounter for screening mammogram for malignant neoplasm of breast: Secondary | ICD-10-CM

## 2022-08-21 DIAGNOSIS — L668 Other cicatricial alopecia: Secondary | ICD-10-CM | POA: Diagnosis not present

## 2022-08-26 ENCOUNTER — Ambulatory Visit (INDEPENDENT_AMBULATORY_CARE_PROVIDER_SITE_OTHER): Payer: Federal, State, Local not specified - PPO | Admitting: Family Medicine

## 2022-08-26 ENCOUNTER — Encounter: Payer: Self-pay | Admitting: Family Medicine

## 2022-08-26 VITALS — BP 144/94 | HR 49 | Temp 98.9°F | Ht 64.0 in | Wt 192.1 lb

## 2022-08-26 DIAGNOSIS — R0683 Snoring: Secondary | ICD-10-CM | POA: Diagnosis not present

## 2022-08-26 DIAGNOSIS — I10 Essential (primary) hypertension: Secondary | ICD-10-CM | POA: Diagnosis not present

## 2022-08-26 DIAGNOSIS — G44209 Tension-type headache, unspecified, not intractable: Secondary | ICD-10-CM

## 2022-08-26 MED ORDER — OLMESARTAN MEDOXOMIL 40 MG PO TABS: 40.0000 mg | ORAL_TABLET | Freq: Every day | ORAL | 3 refills | Status: AC

## 2022-08-26 MED ORDER — KETOROLAC TROMETHAMINE 60 MG/2ML IM SOLN
60.0000 mg | Freq: Once | INTRAMUSCULAR | Status: AC
  Administered 2022-08-26: 60 mg via INTRAMUSCULAR

## 2022-08-26 NOTE — Addendum Note (Signed)
Addended by: Sharon Seller B on: 08/26/2022 01:30 PM   Modules accepted: Orders
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
## 2022-08-26 NOTE — Patient Instructions (Addendum)
Keep the diet clean and stay active.  Check your blood pressures 2-3 times per week, alternating the time of day you check it. If it is high, considering waiting 1-2 minutes and rechecking. If it gets higher, your anxiety is likely creeping up and we should avoid rechecking.   If you do not hear anything about your referral in the next 1-2 weeks, call our office and ask for an update.  Please contact the GI team at: [phone]  Go back to your neck stretches while dealing with this.   Let us know if you need anything.

## 2022-09-22 ENCOUNTER — Other Ambulatory Visit: Payer: Self-pay | Admitting: Family Medicine

## 2022-09-27 ENCOUNTER — Encounter: Payer: Self-pay | Admitting: Family Medicine

## 2022-09-27 ENCOUNTER — Ambulatory Visit: Payer: Federal, State, Local not specified - PPO | Admitting: Family Medicine

## 2022-09-27 VITALS — BP 146/92 | HR 71 | Temp 98.4°F | Ht 64.0 in | Wt 193.5 lb

## 2022-09-27 DIAGNOSIS — R350 Frequency of micturition: Secondary | ICD-10-CM | POA: Diagnosis not present

## 2022-09-27 LAB — POC URINALSYSI DIPSTICK (AUTOMATED)
Bilirubin, UA: NEGATIVE
Blood, UA: NEGATIVE
Glucose, UA: NEGATIVE
Ketones, UA: NEGATIVE
Leukocytes, UA: NEGATIVE
Nitrite, UA: NEGATIVE
Protein, UA: NEGATIVE
Spec Grav, UA: <=1.005 — AB (ref 1.010–1.025)
Urobilinogen, UA: 0.2 E.U./dL
pH, UA: 8 (ref 5.0–8.0)

## 2022-09-27 MED ORDER — SULFAMETHOXAZOLE-TRIMETHOPRIM 800-160 MG PO TABS: 1.0000 | ORAL_TABLET | Freq: Two times a day (BID) | ORAL | 0 refills | Status: AC

## 2022-09-27 NOTE — Patient Instructions (Signed)
Stay hydrated.   Warning signs/symptoms: Uncontrollable nausea/vomiting, fevers, worsening symptoms despite treatment, confusion.  Give us around 2 business days to get culture back to you.  Let us know if you need anything. 
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
## 2022-09-29 LAB — URINE CULTURE
MICRO NUMBER:: 14818061
SPECIMEN QUALITY:: ADEQUATE

## 2022-09-30 ENCOUNTER — Ambulatory Visit: Payer: Federal, State, Local not specified - PPO | Admitting: Family Medicine

## 2022-10-29 ENCOUNTER — Ambulatory Visit: Payer: Federal, State, Local not specified - PPO | Admitting: Family Medicine

## 2022-10-29 ENCOUNTER — Encounter: Payer: Self-pay | Admitting: Family Medicine

## 2022-10-29 VITALS — BP 136/94 | HR 89 | Temp 97.9°F | Ht 64.0 in | Wt 192.1 lb

## 2022-10-29 DIAGNOSIS — M722 Plantar fascial fibromatosis: Secondary | ICD-10-CM | POA: Diagnosis not present

## 2022-10-29 DIAGNOSIS — I10 Essential (primary) hypertension: Secondary | ICD-10-CM | POA: Diagnosis not present

## 2022-10-29 DIAGNOSIS — H9202 Otalgia, left ear: Secondary | ICD-10-CM

## 2022-10-29 DIAGNOSIS — R6889 Other general symptoms and signs: Secondary | ICD-10-CM

## 2022-10-29 MED ORDER — AMLODIPINE BESYLATE 5 MG PO TABS: 5.0000 mg | ORAL_TABLET | Freq: Every day | ORAL | 3 refills | Status: DC

## 2022-10-29 MED ORDER — MELOXICAM 15 MG PO TABS: 15.0000 mg | ORAL_TABLET | Freq: Every day | ORAL | 0 refills | Status: DC
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
## 2022-10-29 NOTE — Patient Instructions (Signed)
Check your blood pressures 2-3 times per week, alternating the time of day you check it. If it is high, considering waiting 1-2 minutes and rechecking. If it gets higher, your anxiety is likely creeping up and we should avoid rechecking.   I want your blood pressure less than 140 on the top and less than 90 on the bottom consistently. Both goals must be met (ie, 150/70 is too high even though the 70 on the bottom is desirable).   Ice/cold pack over area for 10-15 min twice daily.  OK to take Tylenol 1000 mg (2 extra strength tabs) or 975 mg (3 regular strength tabs) every 6 hours as needed.  Consider Powerstep insoles. There are very quality over the counter inserts. Shop around online and in stores. Dr. Margart Sickles is a cheaper alternative, though is not as high of quality.   Consider a [place] sock to wear at night.  Let us know if you need anything.  Plantar Fasciitis Stretches/exercises Do exercises exactly as told by your health care provider and adjust them as directed. It is normal to feel mild stretching, pulling, tightness, or discomfort as you do these exercises, but you should stop right away if you feel sudden pain or your pain gets worse.   Stretching and range of motion exercises These exercises warm up your muscles and joints and improve the movement and flexibility of your foot. These exercises also help to relieve pain.  Exercise A: Plantar fascia stretch Sit with your left / right leg crossed over your opposite knee. Hold your heel with one hand with that thumb near your arch. With your other hand, hold your toes and gently pull them back toward the top of your foot. You should feel a stretch on the bottom of your toes or your foot or both. Hold this stretch for 30 seconds. Slowly release your toes and return to the starting position. Repeat 2 times. Complete this exercise 3 times per week.  Exercise B: Gastroc, standing Stand with your hands against a wall. Extend your  left / right leg behind you, and bend your front knee slightly. Keeping your heels on the floor and keeping your back knee straight, shift your weight toward the wall without arching your back. You should feel a gentle stretch in your left / right calf. Hold this position for 30 seconds. Repeat 2 times. Complete this exercise 3 times a week. Exercise C: Soleus, standing Stand with your hands against a wall. Extend your left / right leg behind you, and bend your front knee slightly. Keeping your heels on the floor, bend your back knee and slightly shift your weight over the back leg. You should feel a gentle stretch deep in your calf. Hold this position for 30 seconds. Repeat 2 times. Complete this exercise 3 times per week. Exercise D: Gastrocsoleus, standing Stand with the ball of your left / right foot on a step. The ball of your foot is on the walking surface, right under your toes. Keep your other foot firmly on the same step. Hold onto the wall or a railing for balance. Slowly lift your other foot, allowing your body weight to press your heel down over the edge of the step. You should feel a stretch in your left / right calf. Hold this position for 30 seconds. Return both feet to the step. Repeat this exercise with a slight bend in your left / right knee. Repeat 2 times with your left / right knee straight and  2times with your left / right knee bent. Complete this exercise 3 times a week.  Balance exercise This exercise builds your balance and strength control of your arch to help take pressure off your plantar fascia. Exercise E: Single leg stand Without shoes, stand near a railing or in a doorway. You may hold onto the railing or door frame as needed. Stand on your left / right foot. Keep your big toe down on the floor and try to keep your arch lifted. Do not let your foot roll inward. Hold this position for 30 seconds. If this exercise is too easy, you can try it with your eyes  closed or while standing on a pillow. Repeat 2 times. Complete this exercise 3 times per week. This information is not intended to replace advice given to you by your health care provider. Make sure you discuss any questions you have with your health care provider. Document Released: 06/03/2005 Document Revised: 02/06/2016 Document Reviewed: 04/17/2015 Elsevier Interactive Patient Education  2017 [employer].

## 2022-10-30 LAB — CBC
HCT: 39 % (ref 36.0–46.0)
Hemoglobin: 13.2 g/dL (ref 12.0–15.0)
MCHC: 33.7 g/dL (ref 30.0–36.0)
MCV: 88.3 fl (ref 78.0–100.0)
Platelets: 321 10*3/uL (ref 150.0–400.0)
RBC: 4.42 Mil/uL (ref 3.87–5.11)
RDW: 13.4 % (ref 11.5–15.5)
WBC: 4.9 10*3/uL (ref 4.0–10.5)

## 2022-10-30 LAB — IBC + FERRITIN
Ferritin: 17.2 ng/mL (ref 10.0–291.0)
Iron: 54 ug/dL (ref 42–145)
Saturation Ratios: 15 % — ABNORMAL LOW (ref 20.0–50.0)
TIBC: 361.2 ug/dL (ref 250.0–450.0)
Transferrin: 258 mg/dL (ref 212.0–360.0)

## 2022-10-30 LAB — VITAMIN B12: Vitamin B-12: 127 pg/mL — ABNORMAL LOW (ref 211–911)

## 2022-11-25 ENCOUNTER — Other Ambulatory Visit: Payer: Self-pay | Admitting: Family Medicine

## 2022-11-25 DIAGNOSIS — M722 Plantar fascial fibromatosis: Secondary | ICD-10-CM

## 2022-12-04 ENCOUNTER — Encounter: Payer: Self-pay | Admitting: Family Medicine

## 2022-12-04 ENCOUNTER — Ambulatory Visit: Payer: Federal, State, Local not specified - PPO | Admitting: Family Medicine

## 2022-12-04 VITALS — BP 124/80 | HR 70 | Temp 97.0°F | Ht 64.0 in | Wt 190.2 lb

## 2022-12-04 DIAGNOSIS — D510 Vitamin B12 deficiency anemia due to intrinsic factor deficiency: Secondary | ICD-10-CM | POA: Diagnosis not present

## 2022-12-04 DIAGNOSIS — I10 Essential (primary) hypertension: Secondary | ICD-10-CM | POA: Diagnosis not present

## 2022-12-04 MED ORDER — CYANOCOBALAMIN 1000 MCG/ML IJ SOLN
1000.0000 ug | Freq: Once | INTRAMUSCULAR | Status: AC
Start: 2022-12-04 — End: 2022-12-04
  Administered 2022-12-04: 1000 ug via INTRAMUSCULAR

## 2022-12-04 NOTE — Addendum Note (Signed)
Addended by: Scharlene Gloss B on: 12/04/2022 03:17 PM   Modules accepted: Orders

## 2022-12-04 NOTE — Patient Instructions (Addendum)
Please contact the GI team at: [phone]  Keep the diet clean and stay active.  We will be setting you up for B12 injections again.   Let us know if you need anything.
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
## 2022-12-18 ENCOUNTER — Ambulatory Visit: Payer: Federal, State, Local not specified - PPO

## 2022-12-20 ENCOUNTER — Ambulatory Visit: Payer: Federal, State, Local not specified - PPO | Admitting: Neurology

## 2022-12-20 DIAGNOSIS — D510 Vitamin B12 deficiency anemia due to intrinsic factor deficiency: Secondary | ICD-10-CM | POA: Diagnosis not present

## 2022-12-20 MED ORDER — CYANOCOBALAMIN 1000 MCG/ML IJ SOLN
1000.00 ug | Freq: Once | INTRAMUSCULAR | Status: AC
Start: 2022-12-20 — End: 2022-12-20
  Administered 2022-12-20: 1000 ug via INTRAMUSCULAR

## 2022-12-20 NOTE — Progress Notes (Unsigned)
Hannah Conley is a 57 yo female who presents to the office today for B12 injections (1/4 biweekly), per physician's orders. Original order: Pernicious anemia. Chronic, not stable. Needs to get the injections going again. May need maintenance doses.      B12 injection to left deltoid with no apparent complications. Next injection scheduled in two weeks on 01/01/2023 (per patient request, has to have Wednesday after 12 pm).

## 2022-12-22 ENCOUNTER — Other Ambulatory Visit: Payer: Self-pay | Admitting: Family Medicine

## 2022-12-22 DIAGNOSIS — M722 Plantar fascial fibromatosis: Secondary | ICD-10-CM

## 2023-01-01 ENCOUNTER — Ambulatory Visit: Payer: Federal, State, Local not specified - PPO

## 2023-01-08 ENCOUNTER — Ambulatory Visit: Payer: Federal, State, Local not specified - PPO

## 2023-01-09 ENCOUNTER — Ambulatory Visit (INDEPENDENT_AMBULATORY_CARE_PROVIDER_SITE_OTHER): Payer: Federal, State, Local not specified - PPO

## 2023-01-09 DIAGNOSIS — D510 Vitamin B12 deficiency anemia due to intrinsic factor deficiency: Secondary | ICD-10-CM | POA: Diagnosis not present

## 2023-01-09 MED ORDER — CYANOCOBALAMIN 1000 MCG/ML IJ SOLN
1000.0000 ug | Freq: Once | INTRAMUSCULAR | Status: AC
Start: 2023-01-09 — End: 2023-01-09
  Administered 2023-01-09: 1000 ug via INTRAMUSCULAR

## 2023-01-09 NOTE — Progress Notes (Signed)
Pt here for biweekly 3 of 4 B12 injection per Dr. Carmelia Roller  B12 given R deltoid IM, and pt tolerated injection well.  Next B12 injection scheduled for 2 weeks.

## 2023-01-23 NOTE — Progress Notes (Signed)
Hannah Conley is a 57 y.o. female presents to the office today for 4/4 biweekly injection, per physician's orders. Original order: "12/04/2022: Pernicious anemia. Chronic, not stable. Needs to get the injections going again. May need maintenance doses." (One month thereafter per Dr Carmelia Roller verbally 01/24/23) Cyanocobalamin 1000 mg/ml IM was administered R deltoid today. Patient tolerated injection. Patient due for follow up labs/provider appt: No.  Patient next injection due: 1 month, appt made Yes  Creft, Melton Alar L

## 2023-01-24 ENCOUNTER — Ambulatory Visit: Payer: Federal, State, Local not specified - PPO

## 2023-01-24 ENCOUNTER — Other Ambulatory Visit: Payer: Self-pay | Admitting: Family Medicine

## 2023-01-24 DIAGNOSIS — D510 Vitamin B12 deficiency anemia due to intrinsic factor deficiency: Secondary | ICD-10-CM | POA: Diagnosis not present

## 2023-01-24 DIAGNOSIS — I10 Essential (primary) hypertension: Secondary | ICD-10-CM

## 2023-01-24 MED ORDER — CYANOCOBALAMIN 1000 MCG/ML IJ SOLN
1000.0000 ug | Freq: Once | INTRAMUSCULAR | Status: AC
Start: 2023-01-24 — End: 2023-01-24
  Administered 2023-01-24: 1000 ug via INTRAMUSCULAR

## 2023-01-30 ENCOUNTER — Encounter (INDEPENDENT_AMBULATORY_CARE_PROVIDER_SITE_OTHER): Payer: Self-pay

## 2023-02-05 DIAGNOSIS — L668 Other cicatricial alopecia: Secondary | ICD-10-CM | POA: Diagnosis not present

## 2023-02-05 DIAGNOSIS — L590 Erythema ab igne [dermatitis ab igne]: Secondary | ICD-10-CM | POA: Diagnosis not present
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
## 2023-02-26 ENCOUNTER — Ambulatory Visit (INDEPENDENT_AMBULATORY_CARE_PROVIDER_SITE_OTHER): Payer: Federal, State, Local not specified - PPO

## 2023-02-26 DIAGNOSIS — E538 Deficiency of other specified B group vitamins: Secondary | ICD-10-CM | POA: Diagnosis not present

## 2023-02-26 MED ORDER — CYANOCOBALAMIN 1000 MCG/ML IJ SOLN
1000.0000 ug | Freq: Once | INTRAMUSCULAR | Status: AC
Start: 2023-02-26 — End: 2023-02-26
  Administered 2023-02-26: 1000 ug via INTRAMUSCULAR

## 2023-02-26 NOTE — Progress Notes (Signed)
Hannah Conley is a 57 y.o. female presents to the office today for first monthly B12 injections, per physician's orders. Original order: 12/04/22 per Dr. Carmelia Roller Cyanocobalamin (med), 1000 mg/ml (dose),  IM (route) was administered left deltoid (location) today. Patient tolerated injection.  Patient next injection due: In one month, appt made Yes in one month  [place]

## 2023-03-07 ENCOUNTER — Ambulatory Visit: Payer: Federal, State, Local not specified - PPO | Admitting: Family Medicine

## 2023-03-07 ENCOUNTER — Encounter: Payer: Self-pay | Admitting: Family Medicine

## 2023-03-07 VITALS — BP 152/98 | HR 87 | Temp 98.6°F | Ht 64.0 in | Wt 192.4 lb

## 2023-03-07 DIAGNOSIS — M79602 Pain in left arm: Secondary | ICD-10-CM | POA: Diagnosis not present

## 2023-03-07 DIAGNOSIS — I10 Essential (primary) hypertension: Secondary | ICD-10-CM | POA: Diagnosis not present

## 2023-03-07 MED ORDER — METHYLPREDNISOLONE ACETATE 80 MG/ML IJ SUSP
80.0000 mg | Freq: Once | INTRAMUSCULAR | Status: AC
Start: 2023-03-07 — End: 2023-03-07
  Administered 2023-03-07: 80 mg via INTRAMUSCULAR

## 2023-03-07 MED ORDER — MELOXICAM 15 MG PO TABS
15.0000 mg | ORAL_TABLET | Freq: Every day | ORAL | 0 refills | Status: DC
Start: 2023-03-08 — End: 2023-04-03

## 2023-03-07 MED ORDER — TIZANIDINE HCL 4 MG PO TABS
4.0000 mg | ORAL_TABLET | Freq: Four times a day (QID) | ORAL | 0 refills | Status: AC | PRN
Start: 2023-03-07 — End: ?
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
## 2023-03-07 NOTE — Patient Instructions (Addendum)
Heat (pad or rice pillow in microwave) over affected area, 10-15 minutes twice daily.   Ice/cold pack over area for 10-15 min twice daily.  OK to take Tylenol 1000 mg (2 extra strength tabs) or 975 mg (3 regular strength tabs) every 6 hours as needed.  Take the muscle relaxer/tizanidine 1-2 hours before planned bedtime. If it makes you drowsy, do not take during the day. You can try half a tab the following night.  No ibuprofen for the rest of the day. Start the meloxicam tomorrow.  Let us know if you need anything.  EXERCISES  RANGE OF MOTION (ROM) AND STRETCHING EXERCISES These exercises may help you when beginning to rehabilitate your injury. While completing these exercises, remember:  Restoring tissue flexibility helps normal motion to return to the joints. This allows healthier, less painful movement and activity. An effective stretch should be held for at least 30 seconds. A stretch should never be painful. You should only feel a gentle lengthening or release in the stretched tissue.  ROM - Pendulum Bend at the waist so that your right / left arm falls away from your body. Support yourself with your opposite hand on a solid surface, such as a table or a countertop. Your right / left arm should be perpendicular to the ground. If it is not perpendicular, you need to lean over farther. Relax the muscles in your right / left arm and shoulder as much as possible. Gently sway your hips and trunk so they move your right / left arm without any use of your right / left shoulder muscles. Progress your movements so that your right / left arm moves side to side, then forward and backward, and finally, both clockwise and counterclockwise. Complete 10-15 repetitions in each direction. Many people use this exercise to relieve discomfort in their shoulder as well as to gain range of motion. Repeat 2 times. Complete this exercise 3 times per week.  STRETCH - Flexion, Standing Stand with good  posture. With an underhand grip on your right / left hand and an overhand grip on the opposite hand, grasp a broomstick or cane so that your hands are a little more than shoulder-width apart. Keeping your right / left elbow straight and shoulder muscles relaxed, push the stick with your opposite hand to raise your right / left arm in front of your body and then overhead. Raise your arm until you feel a stretch in your right / left shoulder, but before you have increased shoulder pain. Try to avoid shrugging your right / left shoulder as your arm rises by keeping your shoulder blade tucked down and toward your mid-back spine. Hold 30 seconds. Slowly return to the starting position. Repeat 2 times. Complete this exercise 3 times per week.  STRETCH - Internal Rotation Place your right / left hand behind your back, palm-up. Throw a towel or belt over your opposite shoulder. Grasp the towel/belt with your right / left hand. While keeping an upright posture, gently pull up on the towel/belt until you feel a stretch in the front of your right / left shoulder. Avoid shrugging your right / left shoulder as your arm rises by keeping your shoulder blade tucked down and toward your mid-back spine. Hold 30. Release the stretch by lowering your opposite hand. Repeat 2 times. Complete this exercise 3 times per week.  STRETCH - External Rotation and Abduction Stagger your stance through a doorframe. It does not matter which foot is forward. As instructed by your  physician, physical therapist or athletic trainer, place your hands: And forearms above your head and on the door frame. And forearms at head-height and on the door frame. At elbow-height and on the door frame. Keeping your head and chest upright and your stomach muscles tight to prevent over-extending your low-back, slowly shift your weight onto your front foot until you feel a stretch across your chest and/or in the front of your shoulders. Hold 30  seconds. Shift your weight to your back foot to release the stretch. Repeat 2 times. Complete this stretch 3 times per week.   STRENGTHENING EXERCISES  These exercises may help you when beginning to rehabilitate your injury. They may resolve your symptoms with or without further involvement from your physician, physical therapist or athletic trainer. While completing these exercises, remember:  Muscles can gain both the endurance and the strength needed for everyday activities through controlled exercises. Complete these exercises as instructed by your physician, physical therapist or athletic trainer. Progress the resistance and repetitions only as guided. You may experience muscle soreness or fatigue, but the pain or discomfort you are trying to eliminate should never worsen during these exercises. If this pain does worsen, stop and make certain you are following the directions exactly. If the pain is still present after adjustments, discontinue the exercise until you can discuss the trouble with your clinician. If advised by your physician, during your recovery, avoid activity or exercises which involve actions that place your right / left hand or elbow above your head or behind your back or head. These positions stress the tissues which are trying to heal.  STRENGTH - Scapular Depression and Adduction With good posture, sit on a firm chair. Supported your arms in front of you with pillows, arm rests or a table top. Have your elbows in line with the sides of your body. Gently draw your shoulder blades down and toward your mid-back spine. Gradually increase the tension without tensing the muscles along the top of your shoulders and the back of your neck. Hold for 3 seconds. Slowly release the tension and relax your muscles completely before completing the next repetition. After you have practiced this exercise, remove the arm support and complete it in standing as well as sitting. Repeat 2 times.  Complete this exercise 3 times per week.   STRENGTH - External Rotators Secure a rubber exercise band/tubing to a fixed object so that it is at the same height as your right / left elbow when you are standing or sitting on a firm surface. Stand or sit so that the secured exercise band/tubing is at your side that is not injured. Bend your elbow 90 degrees. Place a folded towel or small pillow under your right / left arm so that your elbow is a few inches away from your side. Keeping the tension on the exercise band/tubing, pull it away from your body, as if pivoting on your elbow. Be sure to keep your body steady so that the movement is only coming from your shoulder rotating. Hold 3 seconds. Release the tension in a controlled manner as you return to the starting position. Repeat 2 times. Complete this exercise 3 times per week.   STRENGTH - Supraspinatus Stand or sit with good posture. Grasp a 2-3 lb weight or an exercise band/tubing so that your hand is "thumbs-up," like when you shake hands. Slowly lift your right / left hand from your thigh into the air, traveling about 30 degrees from straight out at your  side. Lift your hand to shoulder height or as far as you can without increasing any shoulder pain. Initially, many people do not lift their hands above shoulder height. Avoid shrugging your right / left shoulder as your arm rises by keeping your shoulder blade tucked down and toward your mid-back spine. Hold for 3 seconds. Control the descent of your hand as you slowly return to your starting position. Repeat 2 times. Complete this exercise 3 times per week.   STRENGTH - Shoulder Extensors Secure a rubber exercise band/tubing so that it is at the height of your shoulders when you are either standing or sitting on a firm arm-less chair. With a thumbs-up grip, grasp an end of the band/tubing in each hand. Straighten your elbows and lift your hands straight in front of you at shoulder height.  Step back away from the secured end of band/tubing until it becomes tense. Squeezing your shoulder blades together, pull your hands down to the sides of your thighs. Do not allow your hands to go behind you. Hold for 3 seconds. Slowly ease the tension on the band/tubing as you reverse the directions and return to the starting position. Repeat 2 times. Complete this exercise 3 times per week.   STRENGTH - Scapular Retractors Secure a rubber exercise band/tubing so that it is at the height of your shoulders when you are either standing or sitting on a firm arm-less chair. With a palm-down grip, grasp an end of the band/tubing in each hand. Straighten your elbows and lift your hands straight in front of you at shoulder height. Step back away from the secured end of band/tubing until it becomes tense. Squeezing your shoulder blades together, draw your elbows back as you bend them. Keep your upper arm lifted away from your body throughout the exercise. Hold 3 seconds. Slowly ease the tension on the band/tubing as you reverse the directions and return to the starting position. Repeat 2 times. Complete this exercise 3 times per week.  STRENGTH - Scapular Depressors Find a sturdy chair without wheels, such as a from a dining room table. Keeping your feet on the floor, lift your bottom from the seat and lock your elbows. Keeping your elbows straight, allow gravity to pull your body weight down. Your shoulders will rise toward your ears. Raise your body against gravity by drawing your shoulder blades down your back, shortening the distance between your shoulders and ears. Although your feet should always maintain contact with the floor, your feet should progressively support less body weight as you get stronger. Hold 3 seconds. In a controlled and slow manner, lower your body weight to begin the next repetition. Repeat 2 times. Complete this exercise 3 times per week.    This information is not intended to  replace advice given to you by your health care provider. Make sure you discuss any questions you have with your health care provider.   Document Released: 04/17/2005 Document Revised: 06/24/2014 Document Reviewed: 09/15/2008 Elsevier Interactive Patient Education [employer]

## 2023-03-10 DIAGNOSIS — J9811 Atelectasis: Secondary | ICD-10-CM | POA: Diagnosis not present

## 2023-03-10 DIAGNOSIS — I10 Essential (primary) hypertension: Secondary | ICD-10-CM | POA: Diagnosis not present

## 2023-03-10 DIAGNOSIS — I959 Hypotension, unspecified: Secondary | ICD-10-CM | POA: Diagnosis not present

## 2023-03-10 DIAGNOSIS — I77810 Thoracic aortic ectasia: Secondary | ICD-10-CM | POA: Diagnosis not present

## 2023-03-10 DIAGNOSIS — R570 Cardiogenic shock: Secondary | ICD-10-CM | POA: Diagnosis not present

## 2023-03-10 DIAGNOSIS — M549 Dorsalgia, unspecified: Secondary | ICD-10-CM | POA: Diagnosis not present

## 2023-03-10 DIAGNOSIS — Z885 Allergy status to narcotic agent status: Secondary | ICD-10-CM | POA: Diagnosis not present

## 2023-03-10 DIAGNOSIS — R001 Bradycardia, unspecified: Secondary | ICD-10-CM | POA: Diagnosis not present

## 2023-03-10 DIAGNOSIS — R079 Chest pain, unspecified: Secondary | ICD-10-CM | POA: Diagnosis not present

## 2023-03-10 DIAGNOSIS — R072 Precordial pain: Secondary | ICD-10-CM | POA: Diagnosis not present

## 2023-03-10 DIAGNOSIS — I708 Atherosclerosis of other arteries: Secondary | ICD-10-CM | POA: Diagnosis not present

## 2023-03-10 DIAGNOSIS — I7101 Dissection of ascending aorta: Secondary | ICD-10-CM | POA: Diagnosis not present

## 2023-03-10 DIAGNOSIS — N2889 Other specified disorders of kidney and ureter: Secondary | ICD-10-CM | POA: Diagnosis not present

## 2023-03-10 DIAGNOSIS — D649 Anemia, unspecified: Secondary | ICD-10-CM | POA: Diagnosis not present

## 2023-03-10 DIAGNOSIS — I169 Hypertensive crisis, unspecified: Secondary | ICD-10-CM | POA: Diagnosis not present

## 2023-03-10 DIAGNOSIS — I7103 Dissection of thoracoabdominal aorta: Secondary | ICD-10-CM | POA: Diagnosis not present

## 2023-03-10 DIAGNOSIS — I517 Cardiomegaly: Secondary | ICD-10-CM | POA: Diagnosis not present

## 2023-03-10 DIAGNOSIS — K6389 Other specified diseases of intestine: Secondary | ICD-10-CM | POA: Diagnosis not present

## 2023-03-10 DIAGNOSIS — N170 Acute kidney failure with tubular necrosis: Secondary | ICD-10-CM | POA: Diagnosis not present

## 2023-03-10 DIAGNOSIS — Z880 Allergy status to penicillin: Secondary | ICD-10-CM | POA: Diagnosis not present

## 2023-03-10 DIAGNOSIS — Z79899 Other long term (current) drug therapy: Secondary | ICD-10-CM | POA: Diagnosis not present

## 2023-03-11 DIAGNOSIS — I169 Hypertensive crisis, unspecified: Secondary | ICD-10-CM | POA: Diagnosis not present

## 2023-03-11 DIAGNOSIS — R079 Chest pain, unspecified: Secondary | ICD-10-CM | POA: Diagnosis not present

## 2023-03-11 DIAGNOSIS — I7101 Dissection of ascending aorta: Secondary | ICD-10-CM | POA: Diagnosis not present

## 2023-03-11 DIAGNOSIS — J9811 Atelectasis: Secondary | ICD-10-CM | POA: Diagnosis not present

## 2023-03-11 DIAGNOSIS — I7779 Dissection of other artery: Secondary | ICD-10-CM | POA: Diagnosis not present

## 2023-03-11 DIAGNOSIS — N170 Acute kidney failure with tubular necrosis: Secondary | ICD-10-CM | POA: Diagnosis not present

## 2023-03-11 DIAGNOSIS — R918 Other nonspecific abnormal finding of lung field: Secondary | ICD-10-CM | POA: Diagnosis not present

## 2023-03-11 DIAGNOSIS — I7103 Dissection of thoracoabdominal aorta: Secondary | ICD-10-CM | POA: Diagnosis not present

## 2023-03-11 DIAGNOSIS — I7772 Dissection of iliac artery: Secondary | ICD-10-CM | POA: Diagnosis not present

## 2023-03-11 DIAGNOSIS — I10 Essential (primary) hypertension: Secondary | ICD-10-CM | POA: Diagnosis not present

## 2023-03-11 DIAGNOSIS — I71011 Dissection of aortic arch: Secondary | ICD-10-CM | POA: Diagnosis not present

## 2023-03-11 DIAGNOSIS — N179 Acute kidney failure, unspecified: Secondary | ICD-10-CM | POA: Diagnosis not present

## 2023-03-12 DIAGNOSIS — I71011 Dissection of aortic arch: Secondary | ICD-10-CM | POA: Diagnosis not present

## 2023-03-12 DIAGNOSIS — I7103 Dissection of thoracoabdominal aorta: Secondary | ICD-10-CM | POA: Diagnosis not present

## 2023-03-12 DIAGNOSIS — Z0189 Encounter for other specified special examinations: Secondary | ICD-10-CM | POA: Diagnosis not present

## 2023-03-12 DIAGNOSIS — N179 Acute kidney failure, unspecified: Secondary | ICD-10-CM | POA: Diagnosis not present

## 2023-03-12 DIAGNOSIS — I7101 Dissection of ascending aorta: Secondary | ICD-10-CM | POA: Diagnosis not present

## 2023-03-12 DIAGNOSIS — Z48812 Encounter for surgical aftercare following surgery on the circulatory system: Secondary | ICD-10-CM | POA: Diagnosis not present

## 2023-03-12 DIAGNOSIS — Z95828 Presence of other vascular implants and grafts: Secondary | ICD-10-CM | POA: Diagnosis not present

## 2023-03-12 DIAGNOSIS — I10 Essential (primary) hypertension: Secondary | ICD-10-CM | POA: Diagnosis not present

## 2023-03-13 DIAGNOSIS — R0603 Acute respiratory distress: Secondary | ICD-10-CM | POA: Diagnosis not present

## 2023-03-13 DIAGNOSIS — R140 Abdominal distension (gaseous): Secondary | ICD-10-CM | POA: Diagnosis not present

## 2023-03-13 DIAGNOSIS — R918 Other nonspecific abnormal finding of lung field: Secondary | ICD-10-CM | POA: Diagnosis not present

## 2023-03-13 DIAGNOSIS — N179 Acute kidney failure, unspecified: Secondary | ICD-10-CM | POA: Diagnosis not present

## 2023-03-14 DIAGNOSIS — N179 Acute kidney failure, unspecified: Secondary | ICD-10-CM | POA: Diagnosis not present

## 2023-03-14 DIAGNOSIS — J9811 Atelectasis: Secondary | ICD-10-CM | POA: Diagnosis not present

## 2023-03-14 DIAGNOSIS — Z48813 Encounter for surgical aftercare following surgery on the respiratory system: Secondary | ICD-10-CM | POA: Diagnosis not present

## 2023-03-15 DIAGNOSIS — N179 Acute kidney failure, unspecified: Secondary | ICD-10-CM | POA: Diagnosis not present

## 2023-03-15 DIAGNOSIS — J9811 Atelectasis: Secondary | ICD-10-CM | POA: Diagnosis not present

## 2023-03-16 DIAGNOSIS — N179 Acute kidney failure, unspecified: Secondary | ICD-10-CM | POA: Diagnosis not present

## 2023-03-16 DIAGNOSIS — J9811 Atelectasis: Secondary | ICD-10-CM | POA: Diagnosis not present

## 2023-03-16 DIAGNOSIS — Z48812 Encounter for surgical aftercare following surgery on the circulatory system: Secondary | ICD-10-CM | POA: Diagnosis not present

## 2023-03-17 DIAGNOSIS — J811 Chronic pulmonary edema: Secondary | ICD-10-CM | POA: Diagnosis not present

## 2023-03-17 DIAGNOSIS — R918 Other nonspecific abnormal finding of lung field: Secondary | ICD-10-CM | POA: Diagnosis not present

## 2023-03-17 DIAGNOSIS — I10 Essential (primary) hypertension: Secondary | ICD-10-CM | POA: Diagnosis not present

## 2023-03-17 DIAGNOSIS — I7103 Dissection of thoracoabdominal aorta: Secondary | ICD-10-CM | POA: Diagnosis not present

## 2023-03-17 DIAGNOSIS — I517 Cardiomegaly: Secondary | ICD-10-CM | POA: Diagnosis not present

## 2023-03-17 DIAGNOSIS — N179 Acute kidney failure, unspecified: Secondary | ICD-10-CM | POA: Diagnosis not present

## 2023-03-17 DIAGNOSIS — I7101 Dissection of ascending aorta: Secondary | ICD-10-CM | POA: Diagnosis not present

## 2023-03-17 DIAGNOSIS — J90 Pleural effusion, not elsewhere classified: Secondary | ICD-10-CM | POA: Diagnosis not present

## 2023-03-18 DIAGNOSIS — I10 Essential (primary) hypertension: Secondary | ICD-10-CM | POA: Diagnosis not present

## 2023-03-18 DIAGNOSIS — Z9889 Other specified postprocedural states: Secondary | ICD-10-CM | POA: Diagnosis not present

## 2023-03-18 DIAGNOSIS — I7101 Dissection of ascending aorta: Secondary | ICD-10-CM | POA: Diagnosis not present

## 2023-03-18 DIAGNOSIS — J90 Pleural effusion, not elsewhere classified: Secondary | ICD-10-CM | POA: Diagnosis not present

## 2023-03-18 DIAGNOSIS — J9811 Atelectasis: Secondary | ICD-10-CM | POA: Diagnosis not present

## 2023-03-18 DIAGNOSIS — N179 Acute kidney failure, unspecified: Secondary | ICD-10-CM | POA: Diagnosis not present

## 2023-03-18 DIAGNOSIS — I7103 Dissection of thoracoabdominal aorta: Secondary | ICD-10-CM | POA: Diagnosis not present

## 2023-03-19 DIAGNOSIS — Z95828 Presence of other vascular implants and grafts: Secondary | ICD-10-CM | POA: Diagnosis not present

## 2023-03-19 DIAGNOSIS — I10 Essential (primary) hypertension: Secondary | ICD-10-CM | POA: Diagnosis not present

## 2023-03-19 DIAGNOSIS — I7103 Dissection of thoracoabdominal aorta: Secondary | ICD-10-CM | POA: Diagnosis not present

## 2023-03-19 DIAGNOSIS — R918 Other nonspecific abnormal finding of lung field: Secondary | ICD-10-CM | POA: Diagnosis not present

## 2023-03-19 DIAGNOSIS — J90 Pleural effusion, not elsewhere classified: Secondary | ICD-10-CM | POA: Diagnosis not present

## 2023-03-19 DIAGNOSIS — N179 Acute kidney failure, unspecified: Secondary | ICD-10-CM | POA: Diagnosis not present

## 2023-03-19 DIAGNOSIS — Z978 Presence of other specified devices: Secondary | ICD-10-CM | POA: Diagnosis not present

## 2023-03-19 DIAGNOSIS — I7101 Dissection of ascending aorta: Secondary | ICD-10-CM | POA: Diagnosis not present

## 2023-03-20 DIAGNOSIS — I7101 Dissection of ascending aorta: Secondary | ICD-10-CM | POA: Diagnosis not present

## 2023-03-20 DIAGNOSIS — R918 Other nonspecific abnormal finding of lung field: Secondary | ICD-10-CM | POA: Diagnosis not present

## 2023-03-20 DIAGNOSIS — I10 Essential (primary) hypertension: Secondary | ICD-10-CM | POA: Diagnosis not present

## 2023-03-20 DIAGNOSIS — Z978 Presence of other specified devices: Secondary | ICD-10-CM | POA: Diagnosis not present

## 2023-03-20 DIAGNOSIS — Z95828 Presence of other vascular implants and grafts: Secondary | ICD-10-CM | POA: Diagnosis not present

## 2023-03-20 DIAGNOSIS — M79671 Pain in right foot: Secondary | ICD-10-CM | POA: Diagnosis not present

## 2023-03-20 DIAGNOSIS — J90 Pleural effusion, not elsewhere classified: Secondary | ICD-10-CM | POA: Diagnosis not present

## 2023-03-20 DIAGNOSIS — I7103 Dissection of thoracoabdominal aorta: Secondary | ICD-10-CM | POA: Diagnosis not present

## 2023-03-20 DIAGNOSIS — N179 Acute kidney failure, unspecified: Secondary | ICD-10-CM | POA: Diagnosis not present

## 2023-03-20 DIAGNOSIS — M79661 Pain in right lower leg: Secondary | ICD-10-CM | POA: Diagnosis not present

## 2023-03-21 DIAGNOSIS — R0602 Shortness of breath: Secondary | ICD-10-CM | POA: Diagnosis not present

## 2023-03-21 DIAGNOSIS — I469 Cardiac arrest, cause unspecified: Secondary | ICD-10-CM | POA: Diagnosis not present

## 2023-03-21 DIAGNOSIS — I7103 Dissection of thoracoabdominal aorta: Secondary | ICD-10-CM | POA: Diagnosis not present

## 2023-03-21 DIAGNOSIS — J9811 Atelectasis: Secondary | ICD-10-CM | POA: Diagnosis not present

## 2023-03-26 ENCOUNTER — Ambulatory Visit: Payer: Federal, State, Local not specified - PPO

## 2023-04-03 ENCOUNTER — Other Ambulatory Visit: Payer: Self-pay | Admitting: Family Medicine

## 2023-04-03 DIAGNOSIS — M79602 Pain in left arm: Secondary | ICD-10-CM

## 2023-06-06 ENCOUNTER — Ambulatory Visit: Payer: Federal, State, Local not specified - PPO | Admitting: Family Medicine
